# Patient Record
Sex: Female | Born: 1970 | Race: White | Hispanic: No | Smoking: Current every day smoker
Health system: Southern US, Community
[De-identification: ages and names within clinical notes are randomized; demographics above are authoritative.]

## PROBLEM LIST (undated history)

## (undated) DIAGNOSIS — Z9889 Other specified postprocedural states: Secondary | ICD-10-CM

## (undated) DIAGNOSIS — Z9009 Acquired absence of other part of head and neck: Secondary | ICD-10-CM

## (undated) DIAGNOSIS — C569 Malignant neoplasm of unspecified ovary: Secondary | ICD-10-CM

## (undated) DIAGNOSIS — J42 Unspecified chronic bronchitis: Secondary | ICD-10-CM

## (undated) DIAGNOSIS — Z9089 Acquired absence of other organs: Secondary | ICD-10-CM

## (undated) DIAGNOSIS — E890 Postprocedural hypothyroidism: Secondary | ICD-10-CM

## (undated) DIAGNOSIS — J189 Pneumonia, unspecified organism: Secondary | ICD-10-CM

## (undated) HISTORY — DX: Other specified postprocedural states: Z98.890

## (undated) HISTORY — PX: ABDOMINAL HYSTERECTOMY: SHX81

## (undated) HISTORY — DX: Acquired absence of other part of head and neck: Z90.09

## (undated) HISTORY — DX: Acquired absence of other organs: Z90.89

## (undated) HISTORY — PX: SHOULDER SURGERY: SHX246

## (undated) HISTORY — DX: Postprocedural hypothyroidism: E89.0

## (undated) MED ORDER — CODEINE-BUTALBITAL-ACETAMINOPHEN-CAFFEINE 30 MG-50 MG-325 MG-40 MG CAP
50-325-40-30 mg | ORAL_CAPSULE | ORAL | Status: DC | PRN
Start: ? — End: 2014-07-31

---

## 1998-06-06 HISTORY — PX: TOTAL VAGINAL HYSTERECTOMY: SHX2548

## 2005-06-06 HISTORY — PX: THYROIDECTOMY: SHX17

## 2010-06-06 LAB — METABOLIC PANEL, COMPREHENSIVE
A-G Ratio: 1.2 (ref 0.8–1.7)
ALT (SGPT): 29 U/L — ABNORMAL LOW (ref 30–65)
AST (SGOT): 12 U/L — ABNORMAL LOW (ref 15–37)
Albumin: 4.3 g/dL (ref 3.4–5.0)
Alk. phosphatase: 90 U/L (ref 50–136)
Anion gap: 5 mmol/L (ref 5–15)
BUN/Creatinine ratio: 12 (ref 12–20)
BUN: 11 MG/DL (ref 7–18)
Bilirubin, total: 0.3 MG/DL (ref 0.2–1.0)
CO2: 31 MMOL/L (ref 21–32)
Calcium: 9.4 MG/DL (ref 8.4–10.4)
Chloride: 101 MMOL/L (ref 100–108)
Creatinine: 0.9 MG/DL (ref 0.6–1.3)
GFR est AA: >60 mL/min/{1.73_m2} (ref >60)
GFR est non-AA: >60 mL/min/{1.73_m2} (ref >60)
Globulin: 3.6 g/dL (ref 2.0–4.0)
Glucose: 117 MG/DL — ABNORMAL HIGH (ref 74–99)
Potassium: 4 MMOL/L (ref 3.5–5.5)
Protein, total: 7.9 g/dL (ref 6.4–8.2)
Sodium: 137 MMOL/L (ref 136–145)

## 2010-06-06 LAB — CBC WITH AUTOMATED DIFF
ABS. BASOPHILS: 0.1 10*3/uL — ABNORMAL HIGH (ref 0.0–0.06)
ABS. EOSINOPHILS: 0.1 10*3/uL (ref 0.0–0.4)
ABS. LYMPHOCYTES: 1.4 10*3/uL (ref 0.9–3.6)
ABS. MONOCYTES: 0.4 10*3/uL (ref 0.05–1.2)
ABS. NEUTROPHILS: 4.8 10*3/uL (ref 1.8–8.0)
BASOPHILS: 1 % (ref 0–2)
EOSINOPHILS: 2 % (ref 0–5)
HCT: 46.8 % — ABNORMAL HIGH (ref 35.0–45.0)
HGB: 15.8 g/dL (ref 12.0–16.0)
LYMPHOCYTES: 21 % (ref 21–52)
MCH: 31.3 PG (ref 24.0–34.0)
MCHC: 33.8 g/dL (ref 31.0–37.0)
MCV: 92.9 FL (ref 74.0–97.0)
MONOCYTES: 6 % (ref 3–10)
MPV: 10.2 FL (ref 9.2–11.8)
NEUTROPHILS: 70 % (ref 40–73)
PLATELET: 256 10*3/uL (ref 135–420)
RBC: 5.04 M/uL (ref 4.20–5.30)
RDW: 12.6 % (ref 11.6–14.5)
WBC: 6.8 10*3/uL (ref 4.6–13.2)

## 2010-06-06 LAB — URINALYSIS W/ RFLX MICROSCOPIC
Bilirubin: NEGATIVE
Blood: NEGATIVE
Glucose: NEGATIVE MG/DL
Ketone: NEGATIVE MG/DL
Leukocyte Esterase: NEGATIVE
Nitrites: NEGATIVE
Protein: NEGATIVE MG/DL
Specific gravity: 1.02 (ref 1.003–1.030)
Urobilinogen: 0.2 EU/DL (ref 0.2–1.0)
pH (UA): 7.5 (ref 5.0–8.0)

## 2010-06-06 LAB — AMYLASE: Amylase: 81 U/L (ref 25–115)

## 2010-06-06 LAB — LIPASE: Lipase: 119 U/L (ref 73–393)

## 2011-09-27 MED ORDER — ALBUTEROL 90 MCG/ACTUATION AEROSOL INHALER
90 mcg/actuation | RESPIRATORY_TRACT | Status: DC | PRN
Start: 2011-09-27 — End: 2015-02-17

## 2011-09-27 MED ORDER — CODEINE-GUAIFENESIN 10 MG-100 MG/5 ML ORAL LIQUID
100-10 mg/5 mL | Freq: Three times a day (TID) | ORAL | Status: DC | PRN
Start: 2011-09-27 — End: 2014-07-31

## 2011-09-27 MED ORDER — AZITHROMYCIN 250 MG TAB
250 mg | PACK | ORAL | Status: DC
Start: 2011-09-27 — End: 2014-07-31

## 2011-09-27 NOTE — ED Notes (Signed)
I have reviewed discharge instructions with the patient.  The patient verbalized understanding.

## 2011-09-27 NOTE — ED Provider Notes (Signed)
I was personally available for consultation in the emergency department.  I have reviewed the chart and agree with the documentation recorded by the MLP, including the assessment, treatment plan, and disposition.  Yobany Vroom J Isabela Nardelli, MD

## 2011-09-27 NOTE — ED Provider Notes (Signed)
HPI Comments: 41 y.o. female with Hx of bronchitis presents to ED C/O cold Sx x10 days. Sx include productive cough with yellow secretions, congestion, wheezing, difficulty breathing, facial pressure, and ear pain. Pt's tried Mucinex with no relief. Pt Hx of PNA 4x and states she usually has a low grade fever with PNA. Pt denies fever and any other Sx or complaints    -smoker (quit 3 weeks ago), EtOH    Patient is a 41 y.o. female presenting with cough. The history is provided by the patient. No language interpreter was used.   Cough  This is a new problem. Episode onset: 10 days ago. The problem has not changed since onset.Associated symptoms include ear pain and wheezing. Pertinent negatives include no chest pain, no headaches, no rhinorrhea, no sore throat, no myalgias, no shortness of breath, no nausea and no vomiting.        Written by Carolyn Lamb, ED Scribe, as dictated by Carolyn Liner, PA-C.    Past Medical History   Diagnosis Date   ??? Hypothyroid    ??? Pneumonia         Past Surgical History   Procedure Date   ??? Hx thyroidectomy    ??? Hx cesarean section    ??? Hx hysterectomy    ??? Hx shoulder arthroscopy          No family history on file.     History     Social History   ??? Marital Status: SINGLE     Spouse Name: N/A     Number of Children: N/A   ??? Years of Education: N/A     Occupational History   ??? Not on file.     Social History Main Topics   ??? Smoking status: Former Smoker     Quit date: 09/06/2011   ??? Smokeless tobacco: Not on file   ??? Alcohol Use:    ??? Drug Use:    ??? Sexually Active:      Other Topics Concern   ??? Not on file     Social History Narrative   ??? No narrative on file                  ALLERGIES: Review of patient's allergies indicates no known allergies.      Review of Systems   Constitutional: Negative for fever and fatigue.   HENT: Positive for ear pain and congestion. Negative for sore throat and rhinorrhea.         +facial pain   Respiratory: Positive for cough and wheezing.  Negative for shortness of breath.         +difficulty breathing   Cardiovascular: Negative for chest pain and palpitations.   Gastrointestinal: Negative for nausea, vomiting, abdominal pain and diarrhea.   Genitourinary: Negative for dysuria and difficulty urinating.   Musculoskeletal: Negative for myalgias and arthralgias.   Skin: Negative for color change and rash.   Neurological: Negative for light-headedness and headaches.   All other systems reviewed and are negative.        Written by Carolyn Lamb, ED Scribe, as dictated by Carolyn Liner, PA-C.     Filed Vitals:    09/26/40 0741   BP: 127/91   Pulse: 88   Temp: 97.9 ??F (36.6 ??C)   Resp: 22   Height: 5' (1.524 m)   Weight: 57.607 kg (127 lb)   SpO2: 100%            Physical Exam  Nursing note and vitals reviewed.  Constitutional: She is oriented to person, place, and time. She appears well-developed and well-nourished. No distress.   HENT:   Head: Normocephalic and atraumatic.   Right Ear: Tympanic membrane, external ear and ear canal normal.   Left Ear: Tympanic membrane, external ear and ear canal normal.   Nose: Mucosal edema and rhinorrhea present. Right sinus exhibits maxillary sinus tenderness. Left sinus exhibits maxillary sinus tenderness.   Mouth/Throat: Uvula is midline, oropharynx is clear and moist and mucous membranes are normal. No oropharyngeal exudate, posterior oropharyngeal edema or posterior oropharyngeal erythema.   Eyes: EOM are normal. Pupils are equal, round, and reactive to light. Right eye exhibits no discharge. Left eye exhibits no discharge.   Neck: Trachea normal, normal range of motion and full passive range of motion without pain. Neck supple. No rigidity.   Cardiovascular: Normal rate, regular rhythm, normal heart sounds and normal pulses.  Exam reveals no gallop and no friction rub.    No murmur heard.  Pulmonary/Chest: Effort normal. No respiratory distress. She has no wheezes. She has rhonchi. She has no rales. She  exhibits no tenderness.        Mild scattered rhonchi   Abdominal: Soft. Bowel sounds are normal. She exhibits no distension and no mass. There is no tenderness. There is no rebound and no guarding.   Musculoskeletal: Normal range of motion.   Lymphadenopathy:     She has no cervical adenopathy.   Neurological: She is alert and oriented to person, place, and time.   Skin: Skin is warm and dry. No rash noted. She is not diaphoretic.   Psychiatric: She has a normal mood and affect. Judgment normal.        MDM    Procedures    Results    Labs Reviewed - No data to display    No results found for this or any previous visit (from the past 12 hour(s)).    CLINICAL IMPRESSION    1. Bronchitis      7:51 AM  Carolyn Lamb has been counseled regarding her diagnosis, treatment, and plan.  She verbally conveys understanding and agreement of the signs, symptoms, diagnosis, treatment and prognosis and additionally agrees to follow up as discussed.  She also agrees with the care-plan and conveys that all of her questions have been answered.  I have also provided discharge instructions for her that include: educational information regarding their diagnosis and treatment, and list of reasons why they would want to return to the ED prior to their follow-up appointment, should her condition change.    Written by Carolyn Lamb, ED Scribe, as dictated by Carolyn Liner, PA-C.

## 2011-09-27 NOTE — ED Notes (Signed)
Pt states "I feel horrible. I feel like I have bronchitis. I've been like this for ten days. I horrible horrible cough. Both my ears are cloggegd up."

## 2012-10-13 LAB — CBC WITH AUTOMATED DIFF
ABS. BASOPHILS: 0 10*3/uL (ref 0.0–0.06)
ABS. EOSINOPHILS: 0.1 10*3/uL (ref 0.0–0.4)
ABS. LYMPHOCYTES: 1 10*3/uL (ref 0.9–3.6)
ABS. MONOCYTES: 0.4 10*3/uL (ref 0.05–1.2)
ABS. NEUTROPHILS: 7.5 10*3/uL (ref 1.8–8.0)
BASOPHILS: 0 % (ref 0–2)
EOSINOPHILS: 1 % (ref 0–5)
HCT: 44.6 % (ref 35.0–45.0)
HGB: 15.3 g/dL (ref 12.0–16.0)
LYMPHOCYTES: 11 % — ABNORMAL LOW (ref 21–52)
MCH: 31.9 PG (ref 24.0–34.0)
MCHC: 34.3 g/dL (ref 31.0–37.0)
MCV: 92.9 FL (ref 74.0–97.0)
MONOCYTES: 4 % (ref 3–10)
MPV: 10.5 FL (ref 9.2–11.8)
NEUTROPHILS: 84 % — ABNORMAL HIGH (ref 40–73)
PLATELET: 254 10*3/uL (ref 135–420)
RBC: 4.8 M/uL (ref 4.20–5.30)
RDW: 12.3 % (ref 11.6–14.5)
WBC: 9 10*3/uL (ref 4.6–13.2)

## 2012-10-13 LAB — METABOLIC PANEL, BASIC
Anion gap: 8 mmol/L (ref 3.0–18)
BUN/Creatinine ratio: 17 (ref 12–20)
BUN: 14 MG/DL (ref 7.0–18)
CO2: 28 mmol/L (ref 21–32)
Calcium: 9 MG/DL (ref 8.5–10.1)
Chloride: 103 mmol/L (ref 100–108)
Creatinine: 0.83 MG/DL (ref 0.6–1.3)
GFR est AA: >60 mL/min/{1.73_m2} (ref >60)
GFR est non-AA: >60 mL/min/{1.73_m2} (ref >60)
Glucose: 106 mg/dL — ABNORMAL HIGH (ref 74–99)
Potassium: 3.9 mmol/L (ref 3.5–5.5)
Sodium: 139 mmol/L (ref 136–145)

## 2012-10-13 MED ORDER — AMOXICILLIN 500 MG TABLET
500 mg | ORAL_TABLET | Freq: Three times a day (TID) | ORAL | Status: AC
Start: 2012-10-13 — End: 2012-10-23

## 2012-10-13 MED ADMIN — sodium chloride 0.9 % bolus infusion 1,000 mL: INTRAVENOUS | @ 19:00:00 | NDC 00409798309

## 2012-10-13 MED ADMIN — ondansetron (ZOFRAN) injection 4 mg: INTRAVENOUS | @ 19:00:00 | NDC 00409475503

## 2012-10-13 MED ADMIN — ketorolac (TORADOL) injection 30 mg: INTRAVENOUS | @ 19:00:00 | NDC 00409379501

## 2012-10-13 MED ADMIN — butalbital-acetaminophen-caffeine (FIORICET, ESGIC) per tablet 1 Tab: ORAL | @ 21:00:00 | NDC 68084039611

## 2012-10-13 NOTE — ED Notes (Signed)
C/o headache since 1000 today, dizziness, facial pain, nausea, vomiting, pt works in the heat. States its the worse headache in her life.

## 2012-10-13 NOTE — ED Notes (Signed)
Patient armband removed and shreddedI have reviewed discharge instructions with the patient.  The patient verbalized understanding. rx x 2 given; pt with visitor at bedside;

## 2012-10-13 NOTE — ED Provider Notes (Signed)
HPI Comments: 2:12 PM   42 y.o. female presents to ED C/O frontal HA, which began gradually 4 hours ago.  Associated sx's include nausea, vomiting (6-7 episodes today), photophobia, dizziness, facial pain, and chills. Pt reports she woke up this morning with what felt like a sinus HA, took Sudafed PE. States her HA worsened over a few hours then developed nausea and vomitin. Took Advil with no relief. The HA is located in a band across her forehead that stops at the temples. States this feels worse than her previous "sinus HAs." Confirms some blurred vision while driving earlier but none now; no diplopia, scotomata or flashing lights. HA is worse with laying flat and moving head side-to-side. Dizziness is brought on by movement of head. She is not dizzy at rest.  Pt also confirms rhinorrhea, nasal congestion, sneezing and dry cough x2 days prior to onset of HA. Pt reports 2 of her children have sinus infections and another has PNA. SHx includes thyroidectomy. Takes Synthroid. Pt denies Hx of HA or migraine, HTN, diplopia, fever, ear pain, sore throat, one-sided weakness, numbness or tingling in ext, difficulty breathing, neck pain, back pain and any other sx's or complaints.    Patient is a 42 y.o. female presenting with headaches. The history is provided by the patient. No language interpreter was used.   Headache   This is a new problem. The current episode started 3 to 5 hours ago. The problem occurs constantly. The problem has not changed since onset.The headache is aggravated by photophobia. The pain is located in the frontal region. Associated symptoms include dizziness, nausea and vomiting. Pertinent negatives include no fever, no palpitations, no shortness of breath and no weakness.   Written by Broadus John, ED Scribe, as dictated by Zebedee Iba, PA-C.      Past Medical History   Diagnosis Date   ??? Hypothyroid    ??? Pneumonia         Past Surgical History   Procedure Laterality Date   ??? Hx  thyroidectomy     ??? Hx cesarean section     ??? Hx hysterectomy     ??? Hx shoulder arthroscopy           No family history on file.     History     Social History   ??? Marital Status: SINGLE     Spouse Name: N/A     Number of Children: N/A   ??? Years of Education: N/A     Occupational History   ??? Not on file.     Social History Main Topics   ??? Smoking status: Former Smoker     Quit date: 09/06/2011   ??? Smokeless tobacco: Not on file   ??? Alcohol Use:    ??? Drug Use:    ??? Sexually Active:      Other Topics Concern   ??? Not on file     Social History Narrative   ??? No narrative on file            ALLERGIES: Flagyl      Review of Systems   Constitutional: Negative for fever and fatigue.   HENT: Positive for congestion, rhinorrhea and sneezing. Negative for sore throat and neck pain.         +Facial pain   Eyes: Positive for photophobia and visual disturbance (resolved, see HPI).   Respiratory: Positive for cough (dry). Negative for shortness of breath.    Cardiovascular: Negative for chest pain  and palpitations.   Gastrointestinal: Positive for nausea and vomiting. Negative for abdominal pain and diarrhea.   Genitourinary: Negative for dysuria and difficulty urinating.   Musculoskeletal: Negative for myalgias and arthralgias.   Skin: Negative for color change and rash.   Neurological: Positive for dizziness and headaches. Negative for weakness and light-headedness.   All other systems reviewed and are negative.        Filed Vitals:    10/13/12 1630 10/13/12 1658 10/13/12 1700 10/13/12 1715   BP: 113/72  117/73 109/91   Pulse:       Temp:       Resp:       Height:       Weight:       SpO2: 99% 100%              Physical Exam   Nursing note and vitals reviewed.     Vital signs and nursing notes reviewed.    CONSTITUTIONAL: Alert. Well-appearing; well-nourished; in dark room in mild pain distress.  HEAD: Normocephalic; atraumatic.  +Pain over frontal and maxillary sinuses.  EYES: PERRL; EOM's intact. No nystagmus. Conjunctiva  clear. +photophobia.  ENT: TM's normal except for mild effusion behind right TM. External ear normal. Normal nose; no rhinorrhea. Normal pharynx. Moist mucus membranes.  NECK: Supple; FROM without difficulty, non-tender; no cervical lymphadenopathy. No rigidity or stiffness.             CV: Normal S1, S2; no murmurs, rubs, or gallops. No chest wall tenderness.  RESPIRATORY: Normal chest excursion with respiration; breath sounds clear and equal bilaterally; no wheezes, rhonchi, or rales.  GI: Normal bowel sounds; non-distended; non-tender; no guarding or rigidity  BACK:  No evidence of trauma or deformity. Non-tender to palpation. FROM without difficulty. Negative straight leg raise bilaterally.  EXT: Normal ROM in all four extremities; non-tender to palpation.  SKIN: Normal for age and race; warm; dry; good turgor; no apparent lesions or exudate.  NEURO: A & O x3. Cranial nerves 2-12 intact. Motor 5/5 bilaterally. Sensation intact. No meningeal signs. Normal coordination, normal gait.   PSYCH:  Mood and affect appropriate.       RESULTS:    CT HEAD WO CONT    Final Result: IMPRESSION:         No acute intracranial findings               Labs Reviewed   CBC WITH AUTOMATED DIFF - Abnormal; Notable for the following:     NEUTROPHILS 84 (*)     LYMPHOCYTES 11 (*)     All other components within normal limits   METABOLIC PANEL, BASIC - Abnormal; Notable for the following:     Glucose 106 (*)     All other components within normal limits       Recent Results (from the past 12 hour(s))   CBC WITH AUTOMATED DIFF    Collection Time     10/13/12  2:42 PM       Result Value Range    WBC 9.0  4.6 - 13.2 K/uL    RBC 4.80  4.20 - 5.30 M/uL    HGB 15.3  12.0 - 16.0 g/dL    HCT 16.1  09.6 - 04.5 %    MCV 92.9  74.0 - 97.0 FL    MCH 31.9  24.0 - 34.0 PG    MCHC 34.3  31.0 - 37.0 g/dL    RDW 40.9  81.1 - 91.4 %  PLATELET 254  135 - 420 K/uL    MPV 10.5  9.2 - 11.8 FL    NEUTROPHILS 84 (*) 40 - 73 %    LYMPHOCYTES 11 (*) 21 - 52 %     MONOCYTES 4  3 - 10 %    EOSINOPHILS 1  0 - 5 %    BASOPHILS 0  0 - 2 %    ABS. NEUTROPHILS 7.5  1.8 - 8.0 K/UL    ABS. LYMPHOCYTES 1.0  0.9 - 3.6 K/UL    ABS. MONOCYTES 0.4  0.05 - 1.2 K/UL    ABS. EOSINOPHILS 0.1  0.0 - 0.4 K/UL    ABS. BASOPHILS 0.0  0.0 - 0.06 K/UL    DF AUTOMATED     METABOLIC PANEL, BASIC    Collection Time     10/13/12  2:42 PM       Result Value Range    Sodium 139  136 - 145 mmol/L    Potassium 3.9  3.5 - 5.5 mmol/L    Chloride 103  100 - 108 mmol/L    CO2 28  21 - 32 mmol/L    Anion gap 8  3.0 - 18 mmol/L    Glucose 106 (*) 74 - 99 mg/dL    BUN 14  7.0 - 18 MG/DL    Creatinine 0.27  0.6 - 1.3 MG/DL    BUN/Creatinine ratio 17  12 - 20      GFR est AA >60  >60 ml/min/1.68m2    GFR est non-AA >60  >60 ml/min/1.63m2    Calcium 9.0  8.5 - 10.1 MG/DL        MDM     Differential Diagnosis; Clinical Impression; Plan:     DDx: migraine, sinusitis, tension HA, ICH, viral illness, labyrinthitis      ED MEDICATIONS:  Medications   sodium chloride 0.9 % bolus infusion 1,000 mL (0 mL IntraVENous IV Completed 10/13/12 1629)   ketorolac (TORADOL) injection 30 mg (30 mg IntraVENous Given 10/13/12 1446)   ondansetron (ZOFRAN) injection 4 mg (4 mg IntraVENous Given 10/13/12 1446)   butalbital-acetaminophen-caffeine (FIORICET, ESGIC) per tablet 1 Tab (1 Tab Oral Given 10/13/12 1729)         Procedures    PROGRESS NOTE:   2:23 PM   Answered pt's questions regarding treatment.  Written by Broadus John, ED Scribe, as dictated by Zebedee Iba, PA-C     PROGRESS NOTE:  4:30 PM  Pt has been re-examined by Zebedee Iba, PA-C. Pt is feeling much better. She says her HA is down from ???way over 10??? to a 6/10. She???s not nauseous, no vomiting, no neck pain, no vision changes or dizziness. Awaiting blood work.   Written by Broadus John, ED Scribe, as dictated by Zebedee Iba, PA-C.     DISCHARGE NOTE:  4:58 PM    Jaci Carrel Beadle's  results have been reviewed with her.  She has been counseled regarding  her diagnosis, treatment, and plan.  She verbally conveys understanding and agreement of the signs, symptoms, diagnosis, treatment and prognosis and additionally agrees to follow up as discussed.  She also agrees with the care-plan and conveys that all of her questions have been answered.  I have also provided discharge instructions for her that include: educational information regarding their diagnosis and treatment, and list of reasons why they would want to return to the ED prior to their follow-up appointment, should her condition change.     CLINICAL  IMPRESSION:    1. Frontal headache    2. Sinusitis        AFTER VISIT PLAN:  1. Rx Amoxicillin and Fioricet  2. Follow-up with PCP  3. Return if sx's worsen    Written by Broadus John, ED Scribe, as dictated by Zebedee Iba, PA-C

## 2012-10-14 NOTE — ED Provider Notes (Signed)
I was personally available for consultation in the emergency department.  I have reviewed the chart and agree with the documentation recorded by the MLP, including the assessment, treatment plan, and disposition.  Tashan Kreitzer J Kailon Treese, MD

## 2014-07-31 ENCOUNTER — Inpatient Hospital Stay
Admit: 2014-07-31 | Discharge: 2014-08-01 | Disposition: A | Discharge status: Home / Self Care | Payer: Self-pay | Attending: Internal Medicine

## 2014-07-31 ENCOUNTER — Emergency Department: Admit: 2014-08-01 | Payer: Self-pay

## 2014-07-31 DIAGNOSIS — R109 Unspecified abdominal pain: Secondary | ICD-10-CM

## 2014-07-31 LAB — URINALYSIS W/ RFLX MICROSCOPIC
Bilirubin: NEGATIVE
Blood: NEGATIVE
Glucose: NEGATIVE mg/dL
Ketone: NEGATIVE mg/dL
Leukocyte Esterase: NEGATIVE
Nitrites: NEGATIVE
Protein: NEGATIVE mg/dL
Specific gravity: 1.021 (ref 1.005–1.030)
Urobilinogen: 0.2 EU/dL (ref 0.2–1.0)
pH (UA): 6 (ref 5.0–8.0)

## 2014-07-31 NOTE — ED Notes (Signed)
Pt states possible uti, pt states pain after urination, pt reports hx of kidney stones, pt states pain is in back and wraps around to abd on both sides, onset 2 days ago

## 2014-07-31 NOTE — ED Notes (Signed)
Carolyn Lamb was discharged in good and improved condition. The patient's diagnosis, condition and treatment were explained to patient and aftercare instructions were given. The patient verbalized good understanding. Patient armband removed and both labels and armband were placed in shred bin. Patient left ER ambulatory. 2 prescriptions provided. Patient reports pain is currently 8 on numeric scale. Patient provided education about pain medication including dosage and side effects. Patient verbalized understanding. Ride (husband) at bedside to provide transportation home.

## 2014-07-31 NOTE — ED Provider Notes (Signed)
HPI Comments:   6:01 PM  44 y.o. female with a hx of kidney stones presents to ED with her husband C/O right flank pain radiating to the right groin onset 2 days ago that worsened today. The pain worsens when sitting on the right side. Associated symptoms include hematuria and nausea. Pt took Advil for the pain with no relief. PSHx includes hysterectomy and C-section. Patient denies vomiting, diarrhea, fever, or any other symptoms or complaints.     Patient is a 44 y.o. female presenting with flank pain. The history is provided by the patient and the spouse. No language interpreter was used.   Flank Pain   This is a new problem. The current episode started 2 days ago. The problem has been gradually worsening. The pain is present in the right side. The pain radiates to the right groin. Pertinent negatives include no chest pain, no fever, no headaches, no abdominal pain and no dysuria. Treatments tried: Advil. The treatment provided no relief. Risk factors include history of kidney stones.      Written by Freddy JakschKristen Carter, ED Scribe, as dictated by Synthia InnocentBrannon Adonis Yim, PA-C     Past Medical History:   Diagnosis Date   ??? Hypothyroid    ??? Pneumonia    ??? Kidney stone        Past Surgical History:   Procedure Laterality Date   ??? Hx thyroidectomy     ??? Hx cesarean section     ??? Hx hysterectomy     ??? Hx shoulder arthroscopy           History reviewed. No pertinent family history.    History     Social History   ??? Marital Status: SINGLE     Spouse Name: N/A   ??? Number of Children: N/A   ??? Years of Education: N/A     Occupational History   ??? Not on file.     Social History Main Topics   ??? Smoking status: Current Every Day Smoker -- 0.50 packs/day     Last Attempt to Quit: 09/06/2011   ??? Smokeless tobacco: Not on file   ??? Alcohol Use: Yes      Comment: rare   ??? Drug Use: No   ??? Sexual Activity: Not on file     Other Topics Concern   ??? Not on file     Social History Narrative       ALLERGIES: Flagyl      Review of Systems    Constitutional: Negative for fever and fatigue.   HENT: Negative for rhinorrhea and sore throat.    Respiratory: Negative for cough and shortness of breath.    Cardiovascular: Negative for chest pain and palpitations.   Gastrointestinal: Positive for nausea. Negative for vomiting, abdominal pain and diarrhea.   Genitourinary: Positive for hematuria and flank pain (right). Negative for dysuria and difficulty urinating.        Right groin pain   Musculoskeletal: Negative for myalgias and arthralgias.   Skin: Negative for color change and rash.   Neurological: Negative for light-headedness and headaches.   All other systems reviewed and are negative.      Filed Vitals:    07/31/14 1643   BP: 124/86   Pulse: 100   Temp: 98.3 ??F (36.8 ??C)   Resp: 16   Height: 5' (1.524 m)   Weight: 57.607 kg (127 lb)   SpO2: 98%            Physical  Exam   Constitutional: She is oriented to person, place, and time. She appears well-developed and well-nourished. No distress.   HENT:   Head: Normocephalic and atraumatic.   Eyes: Conjunctivae and EOM are normal. Pupils are equal, round, and reactive to light.   Neck: Normal range of motion. Neck supple.   Cardiovascular: Normal rate and regular rhythm.    Pulmonary/Chest: Effort normal and breath sounds normal.   Abdominal: Soft. Bowel sounds are normal. She exhibits no distension. There is no tenderness. There is CVA tenderness (right). There is no rebound and no guarding.   Musculoskeletal: Normal range of motion.        Back:    Neurological: She is alert and oriented to person, place, and time.   Skin: Skin is warm and dry.   Psychiatric: She has a normal mood and affect. Her behavior is normal.   Nursing note and vitals reviewed.       RESULTS:    CT ABD PELV WO CONT   Final Result   Impression:  1.?? Nonobstructing left nephrolithiasis.???????? ??  2.?? No evidence for bladder stone. ??  3.?? No evidence for bowel obstruction.???????? ??  As read by the radiologist.        Labs Reviewed    URINALYSIS W/ RFLX MICROSCOPIC       Recent Results (from the past 12 hour(s))   URINALYSIS W/ RFLX MICROSCOPIC    Collection Time: 07/31/14  4:46 PM   Result Value Ref Range    Color YELLOW      Appearance CLOUDY      Specific gravity 1.021 1.005 - 1.030      pH (UA) 6.0 5.0 - 8.0      Protein NEGATIVE  NEG mg/dL    Glucose NEGATIVE  NEG mg/dL    Ketone NEGATIVE  NEG mg/dL    Bilirubin NEGATIVE  NEG      Blood NEGATIVE  NEG      Urobilinogen 0.2 0.2 - 1.0 EU/dL    Nitrites NEGATIVE  NEG      Leukocyte Esterase NEGATIVE  NEG          MDM  Number of Diagnoses or Management Options     Amount and/or Complexity of Data Reviewed  Clinical lab tests: ordered and reviewed  Tests in the radiology section of CPT??: ordered and reviewed (CT Abd/Pelv)        MEDICATIONS GIVEN:  Medications   HYDROcodone-acetaminophen (NORCO) 5-325 mg per tablet 2 Tab (not administered)        Procedures    PROGRESS NOTE:  6:01 PM  Initial assessment performed.  Written by Freddy Jaksch, ED Scribe, as dictated by Synthia Innocent, PA-C    DISCHARGE NOTE:  9:35 PM   Jaci Carrel Crittendon's  results have been reviewed with her.  She has been counseled regarding her diagnosis, treatment, and plan.  She verbally conveys understanding and agreement of the signs, symptoms, diagnosis, treatment and prognosis and additionally agrees to follow up as discussed.  She also agrees with the care-plan and conveys that all of her questions have been answered.  I have also provided discharge instructions for her that include: educational information regarding their diagnosis and treatment, and list of reasons why they would want to return to the ED prior to their follow-up appointment, should her condition change.    CLINICAL IMPRESSION:    1. Flank pain        PLAN: DISCHARGE HOME    Follow-up  Information     Follow up With Details Comments Contact Info    Al Decant, MD Schedule an appointment as soon as possible for a  visit in 1 week Follow up with primary care. [address] [facility]  [address]  [place] [address]  [phone]      [facility] EMERGENCY DEPT  As needed, If symptoms worsen [address]  [place] [address]  [phone]          Current Discharge Medication List      START taking these medications    Details   carisoprodol (SOMA) 350 mg tablet Take 1 Tab by mouth four (4) times daily. Max Daily Amount: 1,400 mg.  Qty: 20 Tab, Refills: 0      traMADol (ULTRAM) 50 mg tablet Take 1 Tab by mouth every six (6) hours as needed for Pain. Max Daily Amount: 200 mg.  Qty: 20 Tab, Refills: 0         CONTINUE these medications which have NOT CHANGED    Details   levothyroxine (SYNTHROID) 100 mcg tablet Take 100 mcg by mouth Daily (before breakfast).      albuterol (PROVENTIL, VENTOLIN) 90 mcg/actuation inhaler Take 1-2 Puffs by inhalation every four (4) hours as needed for Wheezing.  Qty: 17 g, Refills: 0             This note is prepared by Freddy Jaksch, acting as Neurosurgeon for [employer], PA-C.    Maclane Holloran, PA-C: The scribe's documentation has been prepared under my direction and personally reviewed by me in its entirety. I confirm that the note above accurately reflects all work, treatment, procedures, and medical decision making performed by me.

## 2014-07-31 NOTE — ED Notes (Signed)
Received report from Edison Pace Griffiths, RN and assumed care of patient. Patient ambulated to bathroom with steady gait, no additional needs verbalized at this time.

## 2014-07-31 NOTE — Progress Notes (Signed)
Mark to return lighter to patient

## 2014-08-01 MED ORDER — HYDROCODONE-ACETAMINOPHEN 5 MG-325 MG TAB
5-325 mg | ORAL | Status: AC
Start: 2014-08-01 — End: 2014-07-31
  Administered 2014-08-01: 03:00:00 via ORAL

## 2014-08-01 MED ORDER — TRAMADOL 50 MG TAB
50 mg | ORAL_TABLET | Freq: Four times a day (QID) | ORAL | Status: DC | PRN
Start: 2014-08-01 — End: 2015-02-17

## 2014-08-01 MED ORDER — CARISOPRODOL 350 MG TAB
350 mg | ORAL_TABLET | Freq: Four times a day (QID) | ORAL | Status: DC
Start: 2014-08-01 — End: 2015-02-17

## 2014-08-01 MED FILL — HYDROCODONE-ACETAMINOPHEN 5 MG-325 MG TAB: 5-325 mg | ORAL | Qty: 2

## 2015-02-17 ENCOUNTER — Inpatient Hospital Stay
Admit: 2015-02-17 | Discharge: 2015-02-17 | Disposition: A | Discharge status: Home / Self Care | Payer: Self-pay | Attending: Emergency Medicine

## 2015-02-17 DIAGNOSIS — J0100 Acute maxillary sinusitis, unspecified: Secondary | ICD-10-CM

## 2015-02-17 MED ORDER — AMOXICILLIN CLAVULANATE 875 MG-125 MG TAB
875-125 mg | ORAL_TABLET | Freq: Two times a day (BID) | ORAL | 0 refills | Status: AC
Start: 2015-02-17 — End: 2015-02-27

## 2015-02-17 MED ORDER — TRAMADOL 50 MG TAB
50 mg | ORAL_TABLET | Freq: Four times a day (QID) | ORAL | 0 refills | Status: DC | PRN
Start: 2015-02-17 — End: 2015-06-19

## 2015-02-17 MED ORDER — PSEUDOEPHEDRINE-GUAIFENESIN SR 60 MG-600 MG 12 HR TAB
60-600 mg | ORAL_TABLET | Freq: Two times a day (BID) | ORAL | 0 refills | Status: DC
Start: 2015-02-17 — End: 2015-06-19

## 2015-02-17 NOTE — ED Triage Notes (Signed)
Pt complains of left ear pain, sinus pain, cough, congestion.  Was seen by her PCP three weeks ago and treated for the same, completed a zpack and states she is no better.  Not able to see PCP for another week.

## 2015-02-17 NOTE — ED Notes (Signed)
I have reviewed discharge instructions with the patient.  The patient verbalized understanding.

## 2015-02-17 NOTE — ED Provider Notes (Signed)
HPI Comments: 44y.o. female presents to ED complaining of sinus pain for 1 month and chronic left shoulder pain onset 4 days ago.  Patient has had thick yellow/green nasal drainage and facial pain she describes as achy and pressure for 1 month.  3 weeks ago she saw her PCP for this and was Rx Z-pack and feels like she has worsened since then.  She states she has fevers, chills, fatigue, general throbbing headaches and malaise.  She also states her chronic left shoulder bursitis is bothering her.  She sees Dr. Luci Bank and has cortisone shots, last was 3 months ago.  She usually takes Tramadol for this but has been out.    Patient denies CP, SOB or any other symptoms or complaints.     Patient is a 44 y.o. female presenting with ear pain, sinus pain, cough, and shoulder pain. The history is provided by the patient.   Ear Pain   Pertinent negatives include no chest pain, no abdominal pain, no headaches and no shortness of breath.   Sinus Pain    Associated symptoms include chills, ear pain, sinus pressure and cough. Pertinent negatives include no congestion, no shortness of breath, no neck pain, no neck pain, no headaches and no chest pain.   Cough   Associated symptoms include chills and ear pain. Pertinent negatives include no chest pain, no headaches and no shortness of breath.   Shoulder Pain           Past Medical History:   Diagnosis Date   ??? Hypothyroid    ??? Kidney stone    ??? Pneumonia        Past Surgical History:   Procedure Laterality Date   ??? Hx thyroidectomy     ??? Hx cesarean section     ??? Hx hysterectomy     ??? Hx shoulder arthroscopy           History reviewed. No pertinent family history.    Social History     Social History   ??? Marital status: SINGLE     Spouse name: N/A   ??? Number of children: N/A   ??? Years of education: N/A     Occupational History   ??? Not on file.     Social History Main Topics   ??? Smoking status: Former Smoker     Packs/day: 0.50     Quit date: 02/03/2015    ??? Smokeless tobacco: Not on file   ??? Alcohol use Yes      Comment: rare   ??? Drug use: No   ??? Sexual activity: Not on file     Other Topics Concern   ??? Not on file     Social History Narrative         ALLERGIES: Flagyl [metronidazole]    Review of Systems   Constitutional: Positive for chills, fatigue and fever. Negative for activity change and unexpected weight change.   HENT: Positive for ear pain and sinus pressure. Negative for congestion.    Respiratory: Positive for cough. Negative for shortness of breath.    Cardiovascular: Negative for chest pain.   Gastrointestinal: Negative for abdominal pain.   Genitourinary: Negative for dysuria.   Musculoskeletal: Negative for neck pain.        Left shoulder pain   Skin: Negative for rash.   Neurological: Negative for syncope and headaches.   All other systems reviewed and are negative.      Vitals:    02/17/15 1053  BP: 138/82   Pulse: 84   Resp: 20   Temp: 98.3 ??F (36.8 ??C)   SpO2: 100%   Weight: 55.3 kg (122 lb)   Height: 5' (1.524 m)            Physical Exam   Constitutional: She is oriented to person, place, and time. She appears well-developed and well-nourished. She appears ill. No distress.   HENT:   Head: Normocephalic and atraumatic.   Right Ear: Tympanic membrane, external ear and ear canal normal.   Left Ear: Tympanic membrane, external ear and ear canal normal.   Nose: Mucosal edema and rhinorrhea present. Right sinus exhibits maxillary sinus tenderness. Left sinus exhibits maxillary sinus tenderness.   Mouth/Throat: Uvula is midline, oropharynx is clear and moist and mucous membranes are normal. No oropharyngeal exudate, posterior oropharyngeal edema or posterior oropharyngeal erythema.   Eyes: EOM are normal. Pupils are equal, round, and reactive to light. Right eye exhibits no discharge. Left eye exhibits no discharge.   Neck: Trachea normal, normal range of motion and full passive range of motion without pain. Neck supple. No rigidity.    Cardiovascular: Normal rate, regular rhythm, normal heart sounds and normal pulses.  Exam reveals no gallop and no friction rub.    No murmur heard.  Pulmonary/Chest: Effort normal and breath sounds normal. No respiratory distress. She has no wheezes. She has no rales. She exhibits no tenderness.   Abdominal: Soft. Bowel sounds are normal. She exhibits no distension and no mass. There is no tenderness. There is no rebound and no guarding.   Musculoskeletal: Normal range of motion.        Left shoulder: She exhibits tenderness, pain and decreased strength. She exhibits normal range of motion, no bony tenderness, no swelling, no deformity, no spasm and normal pulse.        Arms:  Lymphadenopathy:     She has no cervical adenopathy.   Neurological: She is alert and oriented to person, place, and time.   Skin: Skin is warm and dry. No rash noted. She is not diaphoretic.   Psychiatric: She has a normal mood and affect. Judgment normal.   Nursing note and vitals reviewed.       MDM  ED Course       Procedures    PROGRESS NOTE:  11:00 AM   Initial assessment performed.    DISCHARGE NOTE:   11:11 AM   Rwanda N Rippetoe's results have been reviewed with patient and/or family. Patient and/or family has been counseled regarding diagnosis, treatment, and plan.  Patient and/or family verbally conveys understanding and agreement of the signs, symptoms, diagnosis, treatment and prognosis and additionally agrees to follow up as discussed.  Patient and/or family also agrees with the care-plan and conveys that all of his/her questions have been answered.  I have also provided discharge instructions for the patient and/or family that include: educational information regarding their diagnosis and treatment, and list of reasons why they would want to return to the ED prior to their follow-up appointment, should patient's condition change.     CLINICAL IMPRESSION    1. Acute maxillary sinusitis, recurrence not specified     2. Bursitis, shoulder, left         AFTER VISIT PLAN    Follow-up Information     Follow up With Details Comments Contact Info    Al Decant, MD Schedule an appointment as soon as possible for a visit in 3 days  [phone]  [facility]  [address]  [place] [address]  [phone]      Alisa Graff, MD Schedule an appointment as soon as possible for a visit in 3 days  [address] [place]  [address]  [place] [address]  [phone]      [facility] EMERGENCY DEPT  If symptoms worsen [address]  [place] [address]  [phone]          Current Discharge Medication List      START taking these medications    Details   amoxicillin-clavulanate (AUGMENTIN) 875-125 mg per tablet Take 1 Tab by mouth two (2) times a day for 10 days.  Qty: 20 Tab, Refills: 0      traMADol (ULTRAM) 50 mg tablet Take 1 Tab by mouth every six (6) hours as needed for Pain. Max Daily Amount: 200 mg.  Qty: 12 Tab, Refills: 0      PSEUDOEPHEDRINE-guaiFENesin (MUCINEX D) 60-600 mg per tablet Take 1 Tab by mouth every twelve (12) hours.  Qty: 15 Tab, Refills: 0         CONTINUE these medications which have NOT CHANGED    Details   levothyroxine (SYNTHROID) 100 mcg tablet Take 100 mcg by mouth Daily (before breakfast).

## 2015-06-05 ENCOUNTER — Inpatient Hospital Stay
Admit: 2015-06-05 | Discharge: 2015-06-05 | Disposition: A | Discharge status: Home / Self Care | Payer: Self-pay | Attending: Emergency Medicine

## 2015-06-05 DIAGNOSIS — J111 Influenza due to unidentified influenza virus with other respiratory manifestations: Secondary | ICD-10-CM

## 2015-06-05 MED ORDER — CODEINE-GUAIFENESIN 10 MG-100 MG/5 ML ORAL LIQUID
100-10 mg/5 mL | Freq: Three times a day (TID) | ORAL | 0 refills | Status: DC | PRN
Start: 2015-06-05 — End: 2015-06-19

## 2015-06-05 MED ORDER — ALBUTEROL SULFATE HFA 90 MCG/ACTUATION AEROSOL INHALER
90 mcg/actuation | RESPIRATORY_TRACT | 0 refills | Status: DC | PRN
Start: 2015-06-05 — End: 2015-06-19

## 2015-06-05 NOTE — ED Provider Notes (Signed)
[facility]  EMERGENCY DEPARTMENT HISTORY AND PHYSICAL EXAM       Date: 06/05/2015   Patient Name: Carolyn Lamb   Date of Birth: 07/18/1970  Medical Record Number: [id number]    History of Presenting Illness     Chief Complaint   Patient presents with   ??? Cough        History Provided By:  patient    Additional History:   12:49 PM  Carolyn Lamb is a 44 y.o. female with a h/o PNA and bronchitis who presents to the emergency department C/O sore throat onset five days ago. Associated sx include bilateral ear pain, dry cough, HA, fatigue, facial sinus pain, and CP secondary to congestion. Pt took Mucinex DM with no relief. Pt has had contact with family with similar sx. Pt has never had a flu shot. Denies SHx of tonsillectomy. Former tobacco user (quit 02/03/2015). Denies hx of HTN/DM and any other sx or complaints at this time.     Primary Care Provider: Al DecantSTEPHEN B STROUD, MD   Specialist:    Past History     Past Medical History:   Past Medical History   Diagnosis Date   ??? Hypothyroid    ??? Kidney stone    ??? Pneumonia         Past Surgical History:   Past Surgical History   Procedure Laterality Date   ??? Hx thyroidectomy     ??? Hx cesarean section     ??? Hx hysterectomy     ??? Hx shoulder arthroscopy          Family History:   No family history on file.     Social History:   Social History   Substance Use Topics   ??? Smoking status: Former Smoker     Packs/day: 0.50     Quit date: 02/03/2015   ??? Smokeless tobacco: Not on file   ??? Alcohol use Yes      Comment: rare        Allergies:   Allergies   Allergen Reactions   ??? Flagyl [Metronidazole] Hives        Review of Systems   Review of Systems   Constitutional: Positive for fatigue. Negative for fever.   HENT: Positive for congestion, ear pain (bilateral) and sore throat.         (+) sinus pain   Respiratory: Positive for cough (dry).    Cardiovascular: Positive for chest pain (secondary to congestion).    Neurological: Positive for headaches.   All other systems reviewed and are negative.      Physical Exam  Vitals:    06/05/15 1230   BP: 126/89   Pulse: 76   Resp: 16   Temp: 97.8 ??F (36.6 ??C)   SpO2: 99%   Weight: 54.4 kg (120 lb)   Height: 5' (1.524 m)       Physical Exam   Nursing note and vitals reviewed.  Vital signs and nursing notes reviewed.    CONSTITUTIONAL: Alert. nontoxic-appearing; well-nourished; in no apparent distress.  HEAD: Normocephalic; atraumatic.  EYES: PERRL; Conjunctiva clear.   ENT: TM's normal. External ear normal. Normal nose; no rhinorrhea. Normal pharynx. Tonsils not enlarged without exudate. Moist mucus membranes.  NECK: Supple; FROM without difficulty, non-tender; no cervical lymphadenopathy.             CV: Normal S1, S2; no murmurs, rubs, or gallops. No chest wall tenderness.  RESPIRATORY: Normal chest  excursion with respiration; breath sounds clear and equal bilaterally; no wheezes, rhonchi, or rales.  SKIN: Normal for age and race; warm; dry; good turgor; no apparent lesions or exudate.  NEURO: A & O x3.   PSYCH:  Mood and affect appropriate.       Diagnostic Study Results     Labs -      Recent Results (from the past 12 hour(s))   STREP THROAT SCREEN    Collection Time: 06/05/15 12:52 PM   Result Value Ref Range    Special Requests: NO SPECIAL REQUESTS      Strep Screen NEGATIVE       Strep Screen (NOTE)  tET PEFORMED BY [id number]         Culture result: PENDING        Radiologic Studies -  No orders to display       Medical Decision Making   I am the first provider for this patient.     I reviewed the vital signs, available nursing notes, past medical history, past surgical history, family history and social history.     Vital Signs-Reviewed the patient's vital signs.   Patient Vitals for the past 12 hrs:   Temp Pulse Resp BP SpO2   06/05/15 1230 97.8 ??F (36.6 ??C) 76 16 126/89 99 %       Pulse Oximetry Analysis - Normal 99% on RA     Medications Given in the ED:   Medications - No data to display     PROGRESS NOTE:  1:18 PM  Initial assessment performed.  Written by Leanora Ivanoff, ED scribe, as dictated by Zebedee Iba, PA-C.    Discharge Note:  1:18 PM   Patient has no new complaints, changes, or physical findings.  Care plan outlined and precautions discussed.  Results were reviewed with the patient. All medications were reviewed with the patient; will d/c home with cheratussin. All of pt's questions and concerns were addressed. Patient was instructed and agrees to follow up with PCP, as well as to return to the ED upon further deterioration. Patient is ready to go home.      Diagnosis   Clinical Impression:   1. Influenza-like illness         Discussion:    Follow-up Information     Follow up With Details Comments Contact Info    Al Decant, MD Schedule an appointment as soon as possible for a visit in 2 days Follow up with primary care physician.  [address] [facility]  [address]  [place] [address]  [phone]      [facility] EMERGENCY DEPT  As needed, If symptoms worsen [address]  [place] [address]  [phone]          Current Discharge Medication List      START taking these medications    Details   guaiFENesin-codeine (CHERATUSSIN AC) 100-10 mg/5 mL solution Take 5 mL by mouth three (3) times daily as needed for Cough. Max Daily Amount: 15 mL.  Qty: 120 mL, Refills: 0         CONTINUE these medications which have NOT CHANGED    Details   cetirizine (ZYRTEC) 10 mg tablet Take  by mouth.      ibuprofen (ADVIL) 200 mg tablet Take  by mouth.      traMADol (ULTRAM) 50 mg tablet Take 1 Tab by mouth every six (6) hours as needed for Pain. Max Daily Amount: 200  mg.  Qty: 12 Tab, Refills: 0      PSEUDOEPHEDRINE-guaiFENesin (MUCINEX D) 60-600 mg per tablet Take 1 Tab by mouth every twelve (12) hours.  Qty: 15 Tab, Refills: 0      levothyroxine (SYNTHROID) 100 mcg tablet Take 100 mcg by mouth Daily (before breakfast).              _______________________________   Attestations:     This note is prepared by .Leanora Ivanoff, acting as a Neurosurgeon for [employer], PA-C    [employer], PA-C: The scribe's documentation has been prepared under my direction and personally reviewed by me in its entirety.  _______________________________

## 2015-06-05 NOTE — ED Triage Notes (Signed)
C/o cough, congestion, sore throat, body aches for 5 days, denies fever.

## 2015-06-05 NOTE — ED Notes (Signed)
I have reviewed discharge instructions with the patient.  The patient verbalized understanding.

## 2015-06-07 LAB — STREP THROAT SCREEN: Strep Screen: NEGATIVE

## 2015-06-19 ENCOUNTER — Inpatient Hospital Stay
Admit: 2015-06-19 | Discharge: 2015-06-19 | Disposition: A | Discharge status: Home / Self Care | Payer: Self-pay | Attending: Emergency Medicine

## 2015-06-19 DIAGNOSIS — K0381 Cracked tooth: Secondary | ICD-10-CM

## 2015-06-19 MED ORDER — TRAMADOL 50 MG TAB
50 mg | ORAL_TABLET | Freq: Four times a day (QID) | ORAL | 0 refills | Status: AC | PRN
Start: 2015-06-19 — End: ?

## 2015-06-19 MED ORDER — CLINDAMYCIN 150 MG CAP
150 mg | ORAL_CAPSULE | Freq: Four times a day (QID) | ORAL | 0 refills | Status: AC
Start: 2015-06-19 — End: 2015-06-29

## 2015-06-19 MED ORDER — DIPHENHYDRAMINE 12.5 MG/5 ML ELIXIR
12.5 mg/5 mL | ORAL | Status: AC
Start: 2015-06-19 — End: 2015-06-19
  Administered 2015-06-19: 21:00:00 via ORAL

## 2015-06-19 MED ORDER — LIDOCAINE 2 % MUCOSAL SOLN
2 % | Status: AC
Start: 2015-06-19 — End: 2015-06-19
  Administered 2015-06-19: 21:00:00 via OROMUCOSAL

## 2015-06-19 MED FILL — DIPHENHYDRAMINE 12.5 MG/5 ML ELIXIR: 12.5 mg/5 mL | ORAL | Qty: 10

## 2015-06-19 MED FILL — LIDOCAINE VISCOUS 2 % MUCOSAL SOLUTION: 2 % | Qty: 15

## 2015-06-19 NOTE — ED Notes (Signed)
Patient armband removed and shredded

## 2015-06-19 NOTE — ED Provider Notes (Signed)
HPI: 06/19/2015, 4:07 PM    Carolyn Lamb is a 45 y.o. female c/o dental pain. It hurts to the right upper part of the jaw and face. No fevers or swelling. Pain is moderate without radiation.  Her illness developed gradually, with symptoms starting 3 days ago. Part of existing filling fell out and she cracked tooth after biting into hard candy. [place] of [place]. There is known dental disease. Is a former smoker.  Additionally this patient denies any earache, sore throat, vomiting, diarrhea, or eye problems.    ROS  Constitutional: No chills  CV: No chest pain  Resp: No dyspnea  GI: No abd pain  All other systems negative    Past Medical and Surgical History  Past Medical History   Diagnosis Date   ??? Hypothyroid    ??? Kidney stone    ??? Pneumonia      Past Surgical History   Procedure Laterality Date   ??? Hx thyroidectomy     ??? Hx cesarean section     ??? Hx hysterectomy     ??? Hx shoulder arthroscopy         If the past medical or past surgical history sections read "No history on file" it reflects documentation that the histories were verbally investigated but the patient denies any past medical or surgical history.     Medications  No current facility-administered medications on file prior to encounter.      Current Outpatient Prescriptions on File Prior to Encounter   Medication Sig Dispense Refill   ??? cetirizine (ZYRTEC) 10 mg tablet Take  by mouth.     ??? ibuprofen (ADVIL) 200 mg tablet Take  by mouth.     ??? levothyroxine (SYNTHROID) 100 mcg tablet Take 100 mcg by mouth Daily (before breakfast).         Allergies  Allergies   Allergen Reactions   ??? Flagyl [Metronidazole] Hives       PHYSICAL EXAM:    Patient Vitals for the past 12 hrs:   Temp Pulse Resp BP SpO2   06/19/15 1553 97.9 ??F (36.6 ??C) 96 20 129/82 100 %       CONSTITUTIONAL: Well developed, well nourished.  Awake & alert.  No acute distress, non-toxic in appearance, not diaphoretic.     EYES: EOMI. Scleral nml appearing.      NOSE:  There is no nasal congestion. Sinuses are non-tender.     THROAT: Posterior pharynx shows no erythema or exudates.  Rt upper 2nd premolar w/ partially dislodged filling and crack. Very tender. No swelling or sign of abscess. No evidence of Ludwig's angina. There is some missing dentition. There is some decayed dentition. No trismus. Secretions controlled. No stridor. Floor is soft.    NECK:  There is no  appreciable cervical lymphadenopathy.  Neck is supple without meningismus. There is no brawny edema.    PULMONARY:  Patient is speaking in full sentences without accessory muscle usage.      MUSCULOSKELETAL:  No peripheral edema.    SKIN:  Skin is warm and dry.  No diaphoresis, rashes, or lesions.      NEUROLOGICAL: Alert, awake, and appropriately oriented.  Normal speech.    MEDICAL DECISION MAKING  Differential Diagnosis: caries, abscess, fracture, gingivitis, ANUG, Ludwig's, PTA, RPA, DSB, work note    Medications   diphenhydrAMINE (BENADRYL) 12.5 mg/5 mL oral elixir 25 mg (not administered)   lidocaine (XYLOCAINE) 2 % viscous solution 15 mL (  not administered)     Based on symptomatology and exam, this patient likely has a toothache due to dental caries. There is no sign of abscess on exam today. I will treat with pain medication (topical dental balls and oral analgesia) and antibiotics. The importance of close dental follow up was stressed to her. She agrees to return to the ED if symptoms worsen.    I have reviewed the plan for treatment with this patient, and she agrees with the plan as discussed.  I have answered her questions and she verbalizes understanding of the discharge instructions.  She feels comfortable going home at this time, to follow up as below, and to return to the ED for any questions or concerns.    Discharge Instructions:  Please return to the ED for any increasing shortness of breath, swelling to jaw, difficulty swallowing, or any  persistent nausea, vomiting, or diarrhea.  Additionally return for any new or worsening symptoms as well as any persisting complaints. Follow your discharge instructions and take any prescription medication as directed.   Follow up with the healthcare referral we provided in the time period specified, and in the meantime please contact or return to the ED for any questions or concerns.     CLINICAL IMPRESSION  1. Cracked tooth    2. Odontalgia        Current Discharge Medication List      START taking these medications    Details   clindamycin (CLEOCIN) 150 mg capsule Take 2 Caps by mouth every six (6) hours for 10 days.  Qty: 80 Cap, Refills: 0      traMADol (ULTRAM) 50 mg tablet Take 1 Tab by mouth every six (6) hours as needed for Pain. Max Daily Amount: 200 mg.  Qty: 20 Tab, Refills: 0         CONTINUE these medications which have NOT CHANGED    Details   cetirizine (ZYRTEC) 10 mg tablet Take  by mouth.      ibuprofen (ADVIL) 200 mg tablet Take  by mouth.      levothyroxine (SYNTHROID) 100 mcg tablet Take 100 mcg by mouth Daily (before breakfast).             Follow-up Information     Follow up With Details Comments Contact Info    DENTIST Go to For follow up with your dentist.     [facility] Go to As needed, If symptoms worsen. [address]  [place] [id number]  [phone]          Attestations:  This note is prepared by Joeseph Amororothy Requina, acting as Scribe for [employer] T[employer], PA-C.    Ladona HornsMeghan Shatonya Passon, PA-C: The scribe's documentation has been prepared under my direction and personally reviewed by me in its entirety. I confirm that the note above accurately reflects all work, treatment, procedures, and medical decision making performed by me.

## 2015-06-19 NOTE — ED Triage Notes (Signed)
Patient with complaints of a toothache to the right lower tooth.

## 2015-06-21 ENCOUNTER — Inpatient Hospital Stay
Admit: 2015-06-21 | Discharge: 2015-06-21 | Disposition: A | Discharge status: Home / Self Care | Payer: Self-pay | Attending: Internal Medicine

## 2015-06-21 DIAGNOSIS — K0889 Other specified disorders of teeth and supporting structures: Secondary | ICD-10-CM

## 2015-06-21 MED ORDER — ACETAMINOPHEN-CODEINE 300 MG-30 MG TAB
300-30 mg | ORAL_TABLET | ORAL | 0 refills | Status: AC | PRN
Start: 2015-06-21 — End: ?

## 2015-06-21 MED ORDER — ONDANSETRON 4 MG TAB, RAPID DISSOLVE
4 mg | ORAL_TABLET | Freq: Three times a day (TID) | ORAL | 0 refills | Status: AC | PRN
Start: 2015-06-21 — End: ?

## 2015-06-21 NOTE — ED Triage Notes (Signed)
Patient reports she was seen here on Friday for a cracked tooth. States they gave her tramadol but it is not helping patient crying in triage.

## 2015-06-21 NOTE — ED Notes (Signed)
I have reviewed discharge instructions with the patient.  The patient verbalized understanding.

## 2015-06-21 NOTE — ED Provider Notes (Signed)
HPI Comments:   1:50 PM     Carolyn Lamb is a 45 y.o. female who presents to ED c/o right upper dental pain onset few days ago. Pt was evaluated in this facility 2 days ago (06/19/15) for cracked tooth and was Rx'd Cleocin as well as Tramadol. Pt reports the given pain medications she was given made her vomit. She states she called her dentist after she was evaluated 2 days ago and has an appointment tomorrow but was instructed to be seen in the ED today. Pt denies any other symptoms or complaints.         Patient is a 45 y.o. female presenting with dental problem. The history is provided by the patient. No language interpreter was used.   Dental Pain    This is a new problem. The problem occurs constantly. The problem has not changed since onset.         Past Medical History:   Diagnosis Date   ??? Hypothyroid    ??? Kidney stone    ??? Pneumonia        Past Surgical History:   Procedure Laterality Date   ??? Hx thyroidectomy     ??? Hx cesarean section     ??? Hx hysterectomy     ??? Hx shoulder arthroscopy           History reviewed. No pertinent family history.    Social History     Social History   ??? Marital status: SINGLE     Spouse name: N/A   ??? Number of children: N/A   ??? Years of education: N/A     Occupational History   ??? Not on file.     Social History Main Topics   ??? Smoking status: Former Smoker     Packs/day: 0.50     Quit date: 02/03/2015   ??? Smokeless tobacco: Not on file   ??? Alcohol use Yes      Comment: rare   ??? Drug use: No   ??? Sexual activity: Not on file     Other Topics Concern   ??? Not on file     Social History Narrative         ALLERGIES: Flagyl [metronidazole]    Review of Systems   HENT: Positive for dental problem (right upper).    All other systems reviewed and are negative.      Vitals:    06/21/15 1345   BP: 125/73   Pulse: 88   Resp: 16   Temp: 98.1 ??F (36.7 ??C)   SpO2: 100%   Weight: 55.3 kg (122 lb)   Height: 5' (1.524 m)            Physical Exam    Constitutional: She is oriented to person, place, and time. She appears well-developed and well-nourished.   Tearful, able to one & close mouth easily, no facial swelling noted   HENT:   Head: Normocephalic and atraumatic.   Nose: Nose normal.   Mouth/Throat: Uvula is midline, oropharynx is clear and moist and mucous membranes are normal. No oral lesions. No trismus in the jaw. Abnormal dentition. No dental abscesses or uvula swelling.       Eyes: Conjunctivae and EOM are normal. Pupils are equal, round, and reactive to light.   Neck: Normal range of motion. Neck supple.   Cardiovascular: Normal rate and regular rhythm.    Pulmonary/Chest: Effort normal and breath sounds normal.   Musculoskeletal: Normal range of  motion.   Lymphadenopathy:     She has no cervical adenopathy.   Neurological: She is alert and oriented to person, place, and time.   Skin: Skin is warm and dry.   Psychiatric: She has a normal mood and affect. Her behavior is normal.   Nursing note and vitals reviewed.     RESULTS:    No orders to display        Labs Reviewed - No data to display    No results found for this or any previous visit (from the past 12 hour(s)).     MDM  ED Course     MEDICATIONS GIVEN:  Medications - No data to display     Procedures    PROGRESS NOTE:  1:50 PM   Initial assessment performed.      DISCHARGE NOTE:  1:55 PM   Carolyn Lamb  results have been reviewed with her.  She has been counseled regarding her diagnosis, treatment, and plan.  She verbally conveys understanding and agreement of the signs, symptoms, diagnosis, treatment and prognosis and additionally agrees to follow up as discussed.  She also agrees with the care-plan and conveys that all of her questions have been answered.  I have also provided discharge instructions for her that include: educational information regarding their diagnosis and treatment, and list of reasons why they would want to return to the ED  prior to their follow-up appointment, should her condition change. The patient and/or family has been provided with education for proper Emergency Department utilization.    CLINICAL IMPRESSION:    1. Pain, dental        PLAN: DISCHARGE HOME    Follow-up Information     Follow up With Details Comments Contact Info    Dentist  Go to Go to your appointment as scheduled     [facility] EMERGENCY DEPT  As needed, If symptoms worsen [address]  [place] [id number]  [phone]          Current Discharge Medication List      START taking these medications    Details   acetaminophen-codeine (TYLENOL-CODEINE #3) 300-30 mg per tablet Take 1 Tab by mouth every four (4) hours as needed for Pain. Max Daily Amount: 6 Tabs.  Qty: 20 Tab, Refills: 0      ondansetron (ZOFRAN ODT) 4 mg disintegrating tablet Take 1 Tab by mouth every eight (8) hours as needed for Nausea.  Qty: 15 Tab, Refills: 0         CONTINUE these medications which have NOT CHANGED    Details   clindamycin (CLEOCIN) 150 mg capsule Take 2 Caps by mouth every six (6) hours for 10 days.  Qty: 80 Cap, Refills: 0      traMADol (ULTRAM) 50 mg tablet Take 1 Tab by mouth every six (6) hours as needed for Pain. Max Daily Amount: 200 mg.  Qty: 20 Tab, Refills: 0      cetirizine (ZYRTEC) 10 mg tablet Take  by mouth.      ibuprofen (ADVIL) 200 mg tablet Take  by mouth.      levothyroxine (SYNTHROID) 100 mcg tablet Take 100 mcg by mouth Daily (before breakfast).             ATTESTATIONS:  This note is prepared by Marchia Meiers, acting as Scribe for [employer], PA-C .    Synthia Innocent, PA-C: The scribe's documentation has been prepared under my direction and personally reviewed  by me in its entirety. I confirm that the note above accurately reflects all work, treatment, procedures, and medical decision making performed by me.

## 2016-03-17 ENCOUNTER — Emergency Department: Payer: Self-pay

## 2016-03-17 ENCOUNTER — Encounter: Payer: Self-pay | Admitting: Emergency Medicine

## 2016-03-17 ENCOUNTER — Emergency Department
Admission: EM | Admit: 2016-03-17 | Discharge: 2016-03-17 | Disposition: A | Discharge status: Home / Self Care | Payer: Self-pay | Attending: Emergency Medicine | Admitting: Emergency Medicine

## 2016-03-17 DIAGNOSIS — Z8543 Personal history of malignant neoplasm of ovary: Secondary | ICD-10-CM | POA: Insufficient documentation

## 2016-03-17 DIAGNOSIS — J4521 Mild intermittent asthma with (acute) exacerbation: Secondary | ICD-10-CM | POA: Insufficient documentation

## 2016-03-17 DIAGNOSIS — F17210 Nicotine dependence, cigarettes, uncomplicated: Secondary | ICD-10-CM | POA: Insufficient documentation

## 2016-03-17 HISTORY — DX: Pneumonia, unspecified organism: J18.9

## 2016-03-17 HISTORY — DX: Unspecified chronic bronchitis: J42

## 2016-03-17 HISTORY — DX: Malignant neoplasm of unspecified ovary: C56.9

## 2016-03-17 MED ORDER — ALBUTEROL SULFATE HFA 108 (90 BASE) MCG/ACT IN AERS: 2.0000 | INHALATION_SPRAY | Freq: Four times a day (QID) | RESPIRATORY_TRACT | 2 refills | Status: DC | PRN

## 2016-03-17 MED ORDER — PREDNISONE 20 MG PO TABS
60.0000 mg | ORAL_TABLET | Freq: Once | ORAL | Status: AC
Start: 2016-03-17 — End: 2016-03-17
  Administered 2016-03-17: 60 mg via ORAL

## 2016-03-17 MED ORDER — PREDNISONE 10 MG PO TABS: ORAL_TABLET | ORAL | 0 refills | Status: DC

## 2016-03-17 MED ORDER — IPRATROPIUM-ALBUTEROL 0.5-2.5 (3) MG/3ML IN SOLN
3.0000 mL | Freq: Once | RESPIRATORY_TRACT | Status: AC
  Administered 2016-03-17: 3 mL via RESPIRATORY_TRACT
  Filled 2016-03-17: qty 3

## 2016-03-17 MED ORDER — IPRATROPIUM-ALBUTEROL 0.5-2.5 (3) MG/3ML IN SOLN
RESPIRATORY_TRACT | Status: AC
  Filled 2016-03-17: qty 3

## 2016-03-17 MED ORDER — PREDNISONE 20 MG PO TABS
ORAL_TABLET | ORAL | Status: AC
  Filled 2016-03-17: qty 3

## 2016-03-17 MED ORDER — IPRATROPIUM-ALBUTEROL 0.5-2.5 (3) MG/3ML IN SOLN
3.0000 mL | Freq: Once | RESPIRATORY_TRACT | Status: AC
  Administered 2016-03-17: 3 mL via RESPIRATORY_TRACT

## 2016-03-17 NOTE — Discharge Instructions (Signed)
Use albuterol 2 puffs 4 times a day as needed for wheezing. Continue prednisone as directed beginning tomorrow as you have had your first dose in the emergency room. A list of clinics is listed on your discharge papers. Call to see if he is taken the patient's.

## 2016-03-17 NOTE — ED Triage Notes (Signed)
Says sob since she woke this am.  Cough, nasal congestion.  Non prod cough.

## 2016-03-17 NOTE — ED Provider Notes (Signed)
EKG interpreted by me Normal sinus rhythm rate of 89, normal axis intervals QRS ST segments. Slight T-wave inversions in V2-V4 with non-ischemic morphology.   Carrie Mew, MD 03/17/16 1150

## 2016-03-17 NOTE — ED Provider Notes (Signed)
[facility] Emergency Department Provider Note  ___________________________________________   First MD Initiated Contact with Patient 03/17/16 [phone]     (approximate)  I have reviewed the triage vital signs and the nursing notes.   HISTORY  Chief Complaint Shortness of Breath   HPI Rachael Miller is a 45 y.o. female is here with complaint of shortness of breath. Patient states she has history of asthma and in the past has been using an inhaler. Patient moved here from [place] and does not have primary care doctor. She has not had her inhaler for many months but states she has not needed it. But the weather changing this one she woke up with shortness of breath and wheezing. She also has a cough that is nonproductive and nasal congestion. She denies any fever or chills. She rates her pain as 8 out of 10.   Past Medical History:  Diagnosis Date  . Chronic bronchitis ([place])   . Ovarian cancer ([place])   . Pneumonia     There are no active problems to display for this patient.   Past Surgical History:  Procedure Laterality Date  . HYSTEROTOMY    . THYROIDECTOMY      Prior to Admission medications   Medication Sig Start Date End Date Taking? Authorizing Provider  albuterol (PROVENTIL HFA;VENTOLIN HFA) 108 (90 Base) MCG/ACT inhaler Inhale 2 puffs into the lungs every 6 (six) hours as needed for wheezing or shortness of breath. 03/17/16   Johnn Hai, PA-C  predniSONE (DELTASONE) 10 MG tablet Take 5 tablets tomorrow, on day 2 take 4 tablets, day 3 take 3 tablets, day 4 take 2 tablets, day 5 take  1 tablets. 03/17/16   Johnn Hai, PA-C    Allergies Review of patient's allergies indicates not on file.  No family history on file.  Social History Social History  Substance Use Topics  . Smoking status: Current Some Day Smoker  . Smokeless tobacco: Never Used  . Alcohol use No    Review of Systems Constitutional: No  fever/chills Eyes: No visual changes. ENT: Positive nasal congestion. Cardiovascular: Denies chest pain. Respiratory: Positive shortness of breath and wheezing. Gastrointestinal: No abdominal pain.  No nausea, no vomiting.   Musculoskeletal: Negative for back pain. Skin: Negative for rash. Neurological: Negative for headaches, focal weakness or numbness.  10-point ROS otherwise negative.  ____________________________________________   PHYSICAL EXAM:  VITAL SIGNS: ED Triage Vitals  Enc Vitals Group     BP 03/17/16 0853 (!) 128/95     Pulse Rate 03/17/16 0853 91     Resp 03/17/16 0853 (!) 22     Temp 03/17/16 0853 98 F (36.7 C)     Temp Source 03/17/16 0853 Oral     SpO2 03/17/16 0853 98 %     Weight 03/17/16 0853 117 lb (53.1 kg)     Height 03/17/16 0853 5\' 4"  (1.626 m)     Head Circumference --      Peak Flow --      Pain Score 03/17/16 0902 8     Pain Loc --      Pain Edu? --      Excl. in [place]? --     Constitutional: Alert and oriented. Well appearing and in no acute distress. Eyes: Conjunctivae are normal. PERRL. EOMI. Head: Atraumatic. Nose: Mild congestion/no rhinnorhea.  EACs are clear bilaterally. TMs are dull bilaterally. Mouth/Throat: Mucous membranes are moist.  Oropharynx non-erythematous. Mild posterior drainage noted. Neck: No  stridor.   Hematological/Lymphatic/Immunilogical: No cervical lymphadenopathy. Cardiovascular: Normal rate, regular rhythm. Grossly normal heart sounds.  Good peripheral circulation. Respiratory: Normal respiratory effort.  No retractions. Lungs CTAB. Gastrointestinal: Soft and nontender. No distention.  Musculoskeletal: Moves upper and lower extremities without any difficulty. Neurologic:  Normal speech and language. No gross focal neurologic deficits are appreciated. No gait instability. Skin:  Skin is warm, dry and intact. No rash noted. Psychiatric: Mood and affect are normal. Speech and behavior are  normal.  ____________________________________________   LABS (all labs ordered are listed, but only abnormal results are displayed)  Labs Reviewed - No data to display ____________________________________________  EKG  Per Dr. Joni Fears ____________________________________________  RADIOLOGY  Chest x-ray per radiologist shows no active cardiopulmonary disease. I, Johnn Hai, personally viewed and evaluated these images (plain radiographs) as part of my medical decision making, as well as reviewing the written report by the radiologist. ____________________________________________   PROCEDURES  Procedure(s) performed: None  Procedures  Critical Care performed: No  ____________________________________________   INITIAL IMPRESSION / ASSESSMENT AND PLAN / ED COURSE  Pertinent labs & imaging results that were available during my care of the patient were reviewed by me and considered in my medical decision making (see chart for details).    Clinical Course   ----------------------------------------- 11:14 AM on 03/17/2016 ----------------------------------------- Improved breathing and patient feels much better.  Patient is greatly improved is not having any difficulty breathing. Patient was given a prescription for albuterol inhaler. She is also to continue taking prednisone tapering dose. Patient has nebulizer machine at home along with solution. She'll follow-up with [facility] clinic if any continued problems. She is also given a list of clinics in the area that use a sliding scale for office these.   ____________________________________________   FINAL CLINICAL IMPRESSION(S) / ED DIAGNOSES  Final diagnoses:  Mild intermittent asthma with exacerbation      NEW MEDICATIONS STARTED DURING THIS VISIT:  Discharge Medication List as of 03/17/2016 11:26 AM    START taking these medications   Details  albuterol (PROVENTIL HFA;VENTOLIN HFA) 108 (90 Base)  MCG/ACT inhaler Inhale 2 puffs into the lungs every 6 (six) hours as needed for wheezing or shortness of breath., Starting Thu 03/17/2016, Print    predniSONE (DELTASONE) 10 MG tablet Take 5 tablets tomorrow, on day 2 take 4 tablets, day 3 take 3 tablets, day 4 take 2 tablets, day 5 take  1 tablets., Print         Note:  This document was prepared using Dragon voice recognition software and may include unintentional dictation errors.    Johnn Hai, PA-C 03/17/16 [place] K[place], PA-C 03/17/16 1457    Carrie Mew, MD 03/17/16 [phone]

## 2016-04-03 ENCOUNTER — Other Ambulatory Visit: Payer: Self-pay | Admitting: Internal Medicine

## 2016-04-03 DIAGNOSIS — J4520 Mild intermittent asthma, uncomplicated: Secondary | ICD-10-CM | POA: Insufficient documentation

## 2016-04-12 ENCOUNTER — Encounter: Payer: Self-pay | Admitting: Internal Medicine

## 2016-04-12 ENCOUNTER — Ambulatory Visit (INDEPENDENT_AMBULATORY_CARE_PROVIDER_SITE_OTHER): Payer: Self-pay | Admitting: Internal Medicine

## 2016-04-12 VITALS — BP 110/80 | HR 66 | Ht 60.0 in | Wt 127.0 lb

## 2016-04-12 DIAGNOSIS — Z23 Encounter for immunization: Secondary | ICD-10-CM

## 2016-04-12 DIAGNOSIS — Z8543 Personal history of malignant neoplasm of ovary: Secondary | ICD-10-CM

## 2016-04-12 DIAGNOSIS — F17200 Nicotine dependence, unspecified, uncomplicated: Secondary | ICD-10-CM | POA: Insufficient documentation

## 2016-04-12 DIAGNOSIS — J4520 Mild intermittent asthma, uncomplicated: Secondary | ICD-10-CM

## 2016-04-12 DIAGNOSIS — E890 Postprocedural hypothyroidism: Secondary | ICD-10-CM | POA: Insufficient documentation

## 2016-04-12 MED ORDER — BUPROPION HCL ER (SR) 150 MG PO TB12: 150.0000 mg | ORAL_TABLET | Freq: Two times a day (BID) | ORAL | 3 refills | Status: DC

## 2016-04-12 MED ORDER — LEVOTHYROXINE SODIUM 112 MCG PO TABS: 112.0000 ug | ORAL_TABLET | Freq: Every day | ORAL | 1 refills | Status: DC

## 2016-04-12 NOTE — Progress Notes (Signed)
Date:  04/12/2016   Name:  Rachael Miller   DOB:  04/20/1971   MRN:  [id number]   Chief Complaint: Establish Care and Thyroid Problem (needs levothyroxine filled. been out for a month and has had a thyroidectomy.) Thyroid Problem  Presents for follow-up visit. Symptoms include depressed mood, fatigue and hoarse voice. Patient reports no constipation or palpitations. (S/p thyroidectomy and needs meds refilled) The symptoms have been worsening.  Asthma  She complains of hoarse voice. There is no shortness of breath or wheezing. The problem occurs intermittently. Pertinent negatives include no chest pain or headaches. Her symptoms are aggravated by change in weather and URI. Her symptoms are alleviated by beta-agonist. She reports complete improvement on treatment. Risk factors for lung disease include smoking/tobacco exposure. Her past medical history is significant for asthma.   Tobacco use - has smoked for many years but quit intermittently on several occasions.  She really wants to quit now - gets chronic bronchitis and has asthma as well.  Ovarian Cancer - had ovarian cancer 10 years ago.  Treated with total hysterectomy and chemotherapy. Has been seeing GYN annually and Oncology every 6 months.  Review of Systems  Constitutional: Positive for fatigue and unexpected weight change (6 lbs since being off levothyroxine). Negative for chills.  HENT: Positive for hoarse voice.   Respiratory: Negative for chest tightness, shortness of breath and wheezing.   Cardiovascular: Negative for chest pain, palpitations and leg swelling.  Gastrointestinal: Negative for abdominal pain and constipation.  Skin: Negative for color change and rash.  Allergic/Immunologic: Negative for environmental allergies.  Neurological: Negative for dizziness and headaches.  Hematological: Negative for adenopathy.  Psychiatric/Behavioral: Negative for dysphoric mood and sleep disturbance.    Patient Active  Problem List   Diagnosis Date Noted  . Asthma in adult, mild intermittent, uncomplicated [id number]    Prior to Admission medications   Medication Sig Start Date End Date Taking? Authorizing Provider  albuterol (PROVENTIL HFA;VENTOLIN HFA) 108 (90 Base) MCG/ACT inhaler Inhale 2 puffs into the lungs every 6 (six) hours as needed for wheezing or shortness of breath. 03/17/16  Yes Johnn Hai, PA-C  levothyroxine (SYNTHROID, LEVOTHROID) 112 MCG tablet Take 112 mcg by mouth daily before breakfast. Dr. Noreene Filbert- [facility]   Yes Historical Provider, MD    Allergies  Allergen Reactions  . Flagyl [Metronidazole] Hives    Past Surgical History:  Procedure Laterality Date  . CESAREAN SECTION    . HYSTEROTOMY    . THYROIDECTOMY      Social History  Substance Use Topics  . Smoking status: Current Some Day Smoker  . Smokeless tobacco: Never Used  . Alcohol use No     Medication list has been reviewed and updated.   Physical Exam  Constitutional: She is oriented to person, place, and time. She appears well-developed. No distress.  HENT:  Head: Normocephalic and atraumatic.  Neck: Normal range of motion. Carotid bruit is not present. No thyroid mass (surgical scar healed) present.  Cardiovascular: Normal rate, regular rhythm and normal heart sounds.   Pulmonary/Chest: Effort normal and breath sounds normal. No respiratory distress. She has no wheezes.  Musculoskeletal: Normal range of motion.  Lymphadenopathy:    She has no cervical adenopathy.  Neurological: She is alert and oriented to person, place, and time.  Skin: Skin is warm, dry and intact. No rash noted.  Psychiatric: She has a normal mood and affect. Her speech is normal and behavior is normal. Thought content  normal.  Nursing note and vitals reviewed.   BP 110/80   Pulse 66   Ht 5' (1.524 m)   Wt 127 lb (57.6 kg)   BMI 24.80 kg/m   Assessment and Plan: 1. Postoperative hypothyroidism Resume supplementation  and recheck labs 3-4 months - levothyroxine (SYNTHROID, LEVOTHROID) 112 MCG tablet; Take 1 tablet (112 mcg total) by mouth daily.  Dispense: 90 tablet; Refill: 1  2. Tobacco use disorder Options discussed - will try bupropion - buPROPion (WELLBUTRIN SR) 150 MG 12 hr tablet; Take 1 tablet (150 mg total) by mouth 2 (two) times daily.  Dispense: 60 tablet; Refill: 3  3. Hx of ovarian cancer Will refer to GYN and Onc - appointments after Jan 1 - Ambulatory referral to Gynecology - Ambulatory referral to Oncology  4. Need for influenza vaccination - Flu Vaccine QUAD 36+ mos IM  5. Asthma in adult, mild intermittent, uncomplicated Continue albuterol as needed   Halina Maidens, MD [facility]  04/12/2016

## 2016-06-04 ENCOUNTER — Emergency Department
Admission: EM | Admit: 2016-06-04 | Discharge: 2016-06-04 | Disposition: A | Discharge status: Home / Self Care | Payer: Self-pay | Attending: Emergency Medicine | Admitting: Emergency Medicine

## 2016-06-04 ENCOUNTER — Encounter: Payer: Self-pay | Admitting: *Deleted

## 2016-06-04 DIAGNOSIS — Z8543 Personal history of malignant neoplasm of ovary: Secondary | ICD-10-CM | POA: Insufficient documentation

## 2016-06-04 DIAGNOSIS — J111 Influenza due to unidentified influenza virus with other respiratory manifestations: Secondary | ICD-10-CM

## 2016-06-04 DIAGNOSIS — R0981 Nasal congestion: Secondary | ICD-10-CM | POA: Insufficient documentation

## 2016-06-04 DIAGNOSIS — R69 Illness, unspecified: Secondary | ICD-10-CM

## 2016-06-04 DIAGNOSIS — R05 Cough: Secondary | ICD-10-CM | POA: Insufficient documentation

## 2016-06-04 DIAGNOSIS — Z79899 Other long term (current) drug therapy: Secondary | ICD-10-CM | POA: Insufficient documentation

## 2016-06-04 DIAGNOSIS — J45909 Unspecified asthma, uncomplicated: Secondary | ICD-10-CM | POA: Insufficient documentation

## 2016-06-04 DIAGNOSIS — E039 Hypothyroidism, unspecified: Secondary | ICD-10-CM | POA: Insufficient documentation

## 2016-06-04 DIAGNOSIS — F17200 Nicotine dependence, unspecified, uncomplicated: Secondary | ICD-10-CM | POA: Insufficient documentation

## 2016-06-04 LAB — INFLUENZA PANEL BY PCR (TYPE A & B)
Influenza A By PCR: NEGATIVE
Influenza B By PCR: NEGATIVE

## 2016-06-04 MED ORDER — OSELTAMIVIR PHOSPHATE 75 MG PO CAPS: 75.0000 mg | ORAL_CAPSULE | Freq: Two times a day (BID) | ORAL | 0 refills | Status: AC

## 2016-06-04 NOTE — ED Triage Notes (Signed)
States generalized body aches and cough for 3 days

## 2016-06-04 NOTE — ED Notes (Signed)
See triage note  States she body aches and congestion for the past 3 days   Cough is prod    Cough is prod

## 2016-06-04 NOTE — ED Notes (Signed)
Pt alert and oriented X4, active, cooperative, pt in NAD. RR even and unlabored, color WNL.  Pt informed to return if any life threatening symptoms occur.   

## 2016-06-04 NOTE — ED Notes (Signed)
FIRST NURSE NOTE: body aches, congestion for the past few days. Been around others who have been sick.

## 2016-06-05 NOTE — ED Provider Notes (Signed)
[facility] Emergency Department Provider Note  ____________________________________________  Time seen: Approximately 2:25 PM  I have reviewed the triage vital signs and the nursing notes.   HISTORY  Chief Complaint Generalized Body Aches    HPI  Rachael Miller is a 45 y.o. female presenting to the emergency department with generalized body aches, congestion and myalgias for the past 3 days. Patient states that she has been coughing intermittently. Patient states the cough is nonproductive and is not disturbing her sleep. No purulent sputum production. She denies fever. Patient denies chest pain, chest tightness, shortness of breath, abdominal pain, nausea, vomiting, diarrhea or constipation. Patient has tried Tylenol and ibuprofen, which have minimally relieved her symptoms. Patient is a Freight forwarder for a [employer], occupational. Several of her staff members had experienced similar symptoms. Her symptoms have been as severe enough for her to miss work. No recent travel. Patient has been afebrile.   Past Medical History:  Diagnosis Date  . Chronic bronchitis ([place])   . History of thyroidectomy    right voice cord was removed during surgery  . Ovarian cancer ([place])    ovarion  . Pneumonia     Patient Active Problem List   Diagnosis Date Noted  . Hx of ovarian cancer 04/12/2016  . Tobacco use disorder 04/12/2016  . 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 Asthma in adult, mild intermittent, uncomplicated [id number]    Past Surgical History:  Procedure Laterality Date  . CESAREAN SECTION    . THYROIDECTOMY  2007   goiter  . TOTAL VAGINAL HYSTERECTOMY  2000   ovarian cancer    Prior to Admission medications   Medication Sig Start Date End Date Taking? Authorizing Provider  albuterol (PROVENTIL HFA;VENTOLIN HFA) 108 (90 Base) MCG/ACT inhaler Inhale 2 puffs into the lungs every 6 (six) hours as needed for wheezing or shortness of breath. 03/17/16    Johnn Hai, PA-C  buPROPion (WELLBUTRIN SR) 150 MG 12 hr tablet Take 1 tablet (150 mg total) by mouth 2 (two) times daily. 04/12/16   Glean Hess, MD  levothyroxine (SYNTHROID, LEVOTHROID) 112 MCG tablet Take 1 tablet (112 mcg total) by mouth daily. 04/12/16   Glean Hess, MD  oseltamivir (TAMIFLU) 75 MG capsule Take 1 capsule (75 mg total) by mouth 2 (two) times daily. 06/04/16 06/09/16  Lannie Fields, PA-C    Allergies Flagyl [metronidazole]  Family History  Problem Relation Age of Onset  . Adopted: Yes    Social History Social History  Substance Use Topics  . Smoking status: Current Some Day Smoker    Packs/day: 1.00    Years: 30.00  . Smokeless tobacco: Never Used  . Alcohol use No     Review of Systems  Constitutional: No fever/chills Eyes: No visual changes. No discharge ENT: Has congestion and non-productive cough. Cardiovascular: no chest pain. Respiratory: No SOB. Gastrointestinal: No abdominal pain.  No nausea, no vomiting.  No diarrhea.  No constipation. Genitourinary: Negative for dysuria. No hematuria Musculoskeletal: Has myalgias.  Skin: Negative for rash. Neurological: Negative for headaches, focal weakness or numbness. 10-point ROS otherwise negative.  ____________________________________________   PHYSICAL EXAM:  VITAL SIGNS: ED Triage Vitals  Enc Vitals Group     BP 06/04/16 1312 (!) 125/94     Pulse Rate 06/04/16 1312 83     Resp 06/04/16 1312 18     Temp 06/04/16 1312 98.3 F (36.8 C)     Temp Source 06/04/16 1312 Oral  SpO2 06/04/16 1312 98 %     Weight 06/04/16 1313 127 lb (57.6 kg)     Height 06/04/16 1313 5' (1.524 m)     Head Circumference --      Peak Flow --      Pain Score 06/04/16 1313 7     Pain Loc --      Pain Edu? --      Excl. in [place]? --      Constitutional: Patient is lying supine in bed.  Eyes: Conjunctivae are normal. PERRL. EOMI. Head: Atraumatic. ENT:      Ears: Tympanic membranes are pearly  bilaterally. No erythema or signs of infection.      Nose: Nasal turbinates are edematous and erythematous bilaterally.      Mouth/Throat: Posterior pharynx is without erythema or tonsillar hypertrophy. Uvula is midline. Hematological/Lymphatic/Immunilogical: No cervical lymphadenopathy. Cardiovascular: Normal rate, regular rhythm. Normal S1 and S2.  Good peripheral circulation. No murmurs, gallops or rubs auscultated. Respiratory: Normal respiratory effort without tachypnea or retractions. Lungs CTAB. Good air entry to the bases with no decreased or absent breath sounds. Gastrointestinal: Bowel sounds 4 quadrants. Soft and nontender to palpation. No guarding or rigidity. No palpable masses. No distention. No CVA tenderness. Skin:  Skin is warm, dry and intact. No rash noted. ____________________________________________   LABS (all labs ordered are listed, but only abnormal results are displayed)  Labs Reviewed  INFLUENZA PANEL BY PCR (TYPE A & B, H1N1)   ____________________________________________  EKG   ____________________________________________  RADIOLOGY   No results found.  ____________________________________________    PROCEDURES  Procedure(s) performed:    Procedures    Medications - No data to display   ____________________________________________   INITIAL IMPRESSION / ASSESSMENT AND PLAN / ED COURSE  Pertinent labs & imaging results that were available during my care of the patient were reviewed by me and considered in my medical decision making (see chart for details).  Review of the [place] CSRS was performed in accordance of the [place] prior to dispensing any controlled drugs.  Clinical Course    Assessment and Plan:  Influenza-like illness: Patient presents with myalgias, congestion and acute nonproductive cough for the past 3 days. No purulent sputum production, excessive fatigue or shortness of breath. Chest x-ray is not warranted at this time.  Patient tested negative for influenza in the emergency department. Patient was discharged with Tamiflu. Patient education was provided regarding the course of a viral upper respiratory tract infection. Vital signs are reassuring at this time. All patient questions were answered. ____________________________________________  FINAL CLINICAL IMPRESSION(S) / ED DIAGNOSES  Final diagnoses:  Influenza-like illness      NEW MEDICATIONS STARTED DURING THIS VISIT:  Discharge Medication List as of 06/04/2016  2:46 PM    START taking these medications   Details  oseltamivir (TAMIFLU) 75 MG capsule Take 1 capsule (75 mg total) by mouth 2 (two) times daily., Starting Sat 06/04/2016, Until Thu 06/09/2016, Print            This chart was dictated using voice recognition software/Dragon. Despite best efforts to proofread, errors can occur which can change the meaning. Any change was purely unintentional.    Lannie Fields, PA-C 06/05/16 1433    Lisa Roca, MD 06/05/16 [phone]

## 2016-06-06 NOTE — Progress Notes (Deleted)
  [place]  Telephone:([phone] [phone]  ID: Rachael Miller OB: [birth year]  MR#: [id number]  [id number]  Patient Care Team: No Pcp Per Patient as PCP - General (General Practice)  CHIEF COMPLAINT: History of ovarian cancer.  INTERVAL HISTORY: ***  REVIEW OF SYSTEMS:   ROS  As per HPI. Otherwise, a complete review of systems is negative.  PAST MEDICAL HISTORY: Past Medical History:  Diagnosis Date  . Chronic bronchitis ([place])   . History of thyroidectomy    right voice cord was removed during surgery  . Ovarian cancer ([place])    ovarion  . Pneumonia     PAST SURGICAL HISTORY: Past Surgical History:  Procedure Laterality Date  . CESAREAN SECTION    . THYROIDECTOMY  2007   goiter  . TOTAL VAGINAL HYSTERECTOMY  2000   ovarian cancer    FAMILY HISTORY: Family History  Problem Relation Age of Onset  . Adopted: Yes    ADVANCED DIRECTIVES (Y/N):  N  HEALTH MAINTENANCE: Social History  Substance Use Topics  . Smoking status: Current Some Day Smoker    Packs/day: 1.00    Years: 30.00  . Smokeless tobacco: Never Used  . Alcohol use No     Colonoscopy:  PAP:  Bone density:  Lipid panel:  Allergies  Allergen Reactions  . Flagyl [Metronidazole] Hives    Current Outpatient Prescriptions  Medication Sig Dispense Refill  . albuterol (PROVENTIL HFA;VENTOLIN HFA) 108 (90 Base) MCG/ACT inhaler Inhale 2 puffs into the lungs every 6 (six) hours as needed for wheezing or shortness of breath. 1 Inhaler 2  . buPROPion (WELLBUTRIN SR) 150 MG 12 hr tablet Take 1 tablet (150 mg total) by mouth 2 (two) times daily. 60 tablet 3  . levothyroxine (SYNTHROID, LEVOTHROID) 112 MCG tablet Take 1 tablet (112 mcg total) by mouth daily. 90 tablet 1  . oseltamivir (TAMIFLU) 75 MG capsule Take 1 capsule (75 mg total) by mouth 2 (two) times daily. 10 capsule 0   No current facility-administered medications for this visit.      OBJECTIVE: There were no vitals filed for this visit.   There is no height or weight on file to calculate BMI.    ECOG FS:{CHL ONC [id number]  General: Well-developed, well-nourished, no acute distress. Eyes: Pink conjunctiva, anicteric sclera. HEENT: Normocephalic, moist mucous membranes, clear oropharnyx. Lungs: Clear to auscultation bilaterally. Heart: Regular rate and rhythm. No rubs, murmurs, or gallops. Abdomen: Soft, nontender, nondistended. No organomegaly noted, normoactive bowel sounds. Musculoskeletal: No edema, cyanosis, or clubbing. Neuro: Alert, answering all questions appropriately. Cranial nerves grossly intact. Skin: No rashes or petechiae noted. Psych: Normal affect. Lymphatics: No cervical, calvicular, axillary or inguinal LAD.   LAB RESULTS:  No results found for: NA, K, CL, CO2, GLUCOSE, BUN, CREATININE, CALCIUM, PROT, ALBUMIN, AST, ALT, ALKPHOS, BILITOT, GFRNONAA, GFRAA  No results found for: WBC, NEUTROABS, HGB, HCT, MCV, PLT   STUDIES: No results found.  ASSESSMENT: History of ovarian cancer.  PLAN:    1. History of ovarian cancer:  Patient expressed understanding and was in agreement with this plan. She also understands that She can call clinic at any time with any questions, concerns, or complaints.   No matching staging information was found for the patient.  Lloyd Huger, MD   06/06/2016 11:47 PM

## 2016-06-08 ENCOUNTER — Inpatient Hospital Stay: Payer: Self-pay | Admitting: Oncology

## 2016-06-23 ENCOUNTER — Encounter: Payer: Self-pay | Admitting: Obstetrics and Gynecology

## 2016-06-27 ENCOUNTER — Inpatient Hospital Stay: Payer: 59 | Attending: Oncology | Admitting: Oncology

## 2016-06-27 NOTE — Progress Notes (Deleted)
  [place]  Telephone:([phone] [phone]  ID: Rachael Miller OB: [birth year]  MR#: [id number]  [id number]  Patient Care Team: No Pcp Per Patient as PCP - General (General Practice)  CHIEF COMPLAINT: History of ovarian cancer.  INTERVAL HISTORY: ***  REVIEW OF SYSTEMS:   ROS  As per HPI. Otherwise, a complete review of systems is negative.  PAST MEDICAL HISTORY: Past Medical History:  Diagnosis Date  . Chronic bronchitis ([place])   . History of thyroidectomy    right voice cord was removed during surgery  . Ovarian cancer ([place])    ovarion  . Pneumonia     PAST SURGICAL HISTORY: Past Surgical History:  Procedure Laterality Date  . CESAREAN SECTION    . THYROIDECTOMY  2007   goiter  . TOTAL VAGINAL HYSTERECTOMY  2000   ovarian cancer    FAMILY HISTORY: Family History  Problem Relation Age of Onset  . Adopted: Yes    ADVANCED DIRECTIVES (Y/N):  N  HEALTH MAINTENANCE: Social History  Substance Use Topics  . Smoking status: Current Some Day Smoker    Packs/day: 1.00    Years: 30.00  . Smokeless tobacco: Never Used  . Alcohol use No     Colonoscopy:  PAP:  Bone density:  Lipid panel:  Allergies  Allergen Reactions  . Flagyl [Metronidazole] Hives    Current Outpatient Prescriptions  Medication Sig Dispense Refill  . albuterol (PROVENTIL HFA;VENTOLIN HFA) 108 (90 Base) MCG/ACT inhaler Inhale 2 puffs into the lungs every 6 (six) hours as needed for wheezing or shortness of breath. 1 Inhaler 2  . buPROPion (WELLBUTRIN SR) 150 MG 12 hr tablet Take 1 tablet (150 mg total) by mouth 2 (two) times daily. 60 tablet 3  . levothyroxine (SYNTHROID, LEVOTHROID) 112 MCG tablet Take 1 tablet (112 mcg total) by mouth daily. 90 tablet 1   No current facility-administered medications for this visit.     OBJECTIVE: There were no vitals filed for this visit.   There is no height or weight on file to calculate BMI.    ECOG  FS:{CHL ONC [id number]  General: Well-developed, well-nourished, no acute distress. Eyes: Pink conjunctiva, anicteric sclera. HEENT: Normocephalic, moist mucous membranes, clear oropharnyx. Lungs: Clear to auscultation bilaterally. Heart: Regular rate and rhythm. No rubs, murmurs, or gallops. Abdomen: Soft, nontender, nondistended. No organomegaly noted, normoactive bowel sounds. Musculoskeletal: No edema, cyanosis, or clubbing. Neuro: Alert, answering all questions appropriately. Cranial nerves grossly intact. Skin: No rashes or petechiae noted. Psych: Normal affect. Lymphatics: No cervical, calvicular, axillary or inguinal LAD.   LAB RESULTS:  No results found for: NA, K, CL, CO2, GLUCOSE, BUN, CREATININE, CALCIUM, PROT, ALBUMIN, AST, ALT, ALKPHOS, BILITOT, GFRNONAA, GFRAA  No results found for: WBC, NEUTROABS, HGB, HCT, MCV, PLT   STUDIES: No results found.  ASSESSMENT: History of ovarian cancer.  PLAN:    1. History of ovarian cancer:  Patient expressed understanding and was in agreement with this plan. She also understands that She can call clinic at any time with any questions, concerns, or complaints.   Cancer Staging No matching staging information was found for the patient.  Rachael Huger, MD   06/27/2016 10:35 AM

## 2016-06-30 ENCOUNTER — Encounter: Payer: Self-pay | Admitting: *Deleted

## 2016-06-30 ENCOUNTER — Emergency Department
Admission: EM | Admit: 2016-06-30 | Discharge: 2016-06-30 | Disposition: A | Discharge status: Home / Self Care | Payer: 59 | Attending: Emergency Medicine | Admitting: Emergency Medicine

## 2016-06-30 DIAGNOSIS — X500XXA Overexertion from strenuous movement or load, initial encounter: Secondary | ICD-10-CM | POA: Diagnosis not present

## 2016-06-30 DIAGNOSIS — Y999 Unspecified external cause status: Secondary | ICD-10-CM | POA: Insufficient documentation

## 2016-06-30 DIAGNOSIS — S3992XA Unspecified injury of lower back, initial encounter: Secondary | ICD-10-CM | POA: Diagnosis present

## 2016-06-30 DIAGNOSIS — Y92009 Unspecified place in unspecified non-institutional (private) residence as the place of occurrence of the external cause: Secondary | ICD-10-CM | POA: Diagnosis not present

## 2016-06-30 DIAGNOSIS — F17200 Nicotine dependence, unspecified, uncomplicated: Secondary | ICD-10-CM | POA: Insufficient documentation

## 2016-06-30 DIAGNOSIS — M5442 Lumbago with sciatica, left side: Secondary | ICD-10-CM | POA: Insufficient documentation

## 2016-06-30 DIAGNOSIS — Z8543 Personal history of malignant neoplasm of ovary: Secondary | ICD-10-CM | POA: Diagnosis not present

## 2016-06-30 DIAGNOSIS — Y9389 Activity, other specified: Secondary | ICD-10-CM | POA: Diagnosis not present

## 2016-06-30 DIAGNOSIS — E039 Hypothyroidism, unspecified: Secondary | ICD-10-CM | POA: Insufficient documentation

## 2016-06-30 DIAGNOSIS — M5432 Sciatica, left side: Secondary | ICD-10-CM

## 2016-06-30 MED ORDER — CYCLOBENZAPRINE HCL 5 MG PO TABS: 5.0000 mg | ORAL_TABLET | Freq: Three times a day (TID) | ORAL | 0 refills | Status: DC | PRN

## 2016-06-30 MED ORDER — KETOROLAC TROMETHAMINE 10 MG PO TABS: 10.0000 mg | ORAL_TABLET | Freq: Three times a day (TID) | ORAL | 0 refills | Status: DC

## 2016-06-30 MED ORDER — ONDANSETRON 4 MG PO TBDP: 4.0000 mg | ORAL_TABLET | Freq: Four times a day (QID) | ORAL | 0 refills | Status: DC | PRN

## 2016-06-30 MED ORDER — KETOROLAC TROMETHAMINE 60 MG/2ML IM SOLN
30.0000 mg | Freq: Once | INTRAMUSCULAR | Status: AC
  Administered 2016-06-30: 30 mg via INTRAMUSCULAR
  Filled 2016-06-30: qty 2

## 2016-06-30 NOTE — Discharge Instructions (Signed)
Your exam is essentially normal, there is no nerve injury or spinal injury. You may have some sciatic nerve irritation causing left lower leg pain. Take the prescription meds for pain and spasms as directed. Apply ice to the area for pain relief. Follow-up with Dr. Army Melia for continued symptoms.

## 2016-06-30 NOTE — ED Triage Notes (Signed)
Pt ambulatory to triage with lower back pain.  Pain radiates into left leg.  No known injury.  Pt taking advil without pain relief.

## 2016-06-30 NOTE — ED Provider Notes (Signed)
[facility] Emergency Department Provider Note ____________________________________________  Time seen: 1643  I have reviewed the triage vital signs and the nursing notes.  HISTORY  Chief Complaint  Back Pain  HPI [place] is a 46 y.o. female visits to the ED with onset of low back pain with radiation to the posterior left leg. She denies any injury, accident, fall. 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 She denies any history of chronic back pain or recurrent lower extremity sciatica. The patient has taken ibuprofen with limited benefit. She denies any distal paresthesias, foot drop, or incontinence.  Past Medical History:  Diagnosis Date  . Chronic bronchitis ([place])   . History of thyroidectomy    right voice cord was removed during surgery  . Ovarian cancer ([place])    ovarion  . Pneumonia     Patient Active Problem List   Diagnosis Date Noted  . Hx of ovarian cancer 04/12/2016  . Tobacco use disorder 04/12/2016  . 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 Asthma in adult, mild intermittent, uncomplicated [id number]    Past Surgical History:  Procedure Laterality Date  . CESAREAN SECTION    . THYROIDECTOMY  2007   goiter  . TOTAL VAGINAL HYSTERECTOMY  2000   ovarian cancer    Prior to Admission medications   Medication Sig Start Date End Date Taking? Authorizing Provider  albuterol (PROVENTIL HFA;VENTOLIN HFA) 108 (90 Base) MCG/ACT inhaler Inhale 2 puffs into the lungs every 6 (six) hours as needed for wheezing or shortness of breath. 03/17/16   Johnn Hai, PA-C  buPROPion (WELLBUTRIN SR) 150 MG 12 hr tablet Take 1 tablet (150 mg total) by mouth 2 (two) times daily. 04/12/16   Glean Hess, MD  cyclobenzaprine (FLEXERIL) 5 MG tablet Take 1 tablet (5 mg total) by mouth 3 (three) times daily as needed for muscle spasms. 06/30/16   Jamichael Knotts V Bacon Mccormick [place], PA-C  ketorolac (TORADOL) 10 MG  tablet Take 1 tablet (10 mg total) by mouth every 8 (eight) hours. 06/30/16   Nalu Troublefield V Bacon Renalda Locklin, PA-C  levothyroxine (SYNTHROID, LEVOTHROID) 112 MCG tablet Take 1 tablet (112 mcg total) by mouth daily. 04/12/16   Glean Hess, MD  ondansetron (ZOFRAN ODT) 4 MG disintegrating tablet Take 1 tablet (4 mg total) by mouth every 6 (six) hours as needed for nausea or vomiting. 06/30/16   Dannielle Karvonen Lynnann Knudsen, PA-C   Allergies Flagyl [metronidazole]  Family History  Problem Relation Age of Onset  . Adopted: Yes    Social History Social History  Substance Use Topics  . Smoking status: Current Some Day Smoker    Packs/day: 1.00    Years: 30.00  . Smokeless tobacco: Never Used  . Alcohol use No    Review of Systems  Constitutional: Negative for fever. Cardiovascular: Negative for chest pain. Respiratory: Negative for shortness of breath. Gastrointestinal: Negative for abdominal pain, vomiting and diarrhea. Genitourinary: Negative for dysuria. Musculoskeletal: Positive for back pain. Neurological: Negative for headaches, focal weakness or numbness. ____________________________________________  PHYSICAL EXAM:  VITAL SIGNS: ED Triage Vitals  Enc Vitals Group     BP 06/30/16 1518 90/64     Pulse Rate 06/30/16 1518 86     Resp 06/30/16 1518 18     Temp 06/30/16 1518 98.2 F (36.8 C)     Temp Source 06/30/16 1518 Oral     SpO2 06/30/16 1518 100 %     Weight 06/30/16 1519  125 lb (56.7 kg)     Height 06/30/16 1519 5' (1.524 m)     Head Circumference --      Peak Flow --      Pain Score 06/30/16 1520 10     Pain Loc --      Pain Edu? --      Excl. in [place]? --    Constitutional: Alert and oriented. Well appearing and in no distress. Head: Normocephalic and atraumatic. Cardiovascular: Normal rate, regular rhythm. Normal distal pulses. Respiratory: Normal respiratory effort. No wheezes/rales/rhonchi. Musculoskeletal: Patient with normal spinal alignment without midline  tenderness, spasm, deformity, or step-off. She has tenderness to palpation over the left SI joint region. She is to demonstrate a normal sit stand transition. She is noted to have a negative seated straight leg raise. Normal hip flexion and extension and rotational range of motion. Nontender with normal range of motion in all extremities.  Neurologic:  Mildly antalgic gait without ataxia. Normal LE DTRs bilaterally. Normal toe dorsiflexion and foot eversion on exam. Normal toe raise and heel raise them treated. Normal speech and language. No gross focal neurologic deficits are appreciated. ____________________________________________  PROCEDURES  Toradol 30 mg IM ____________________________________________  INITIAL IMPRESSION / ASSESSMENT AND PLAN / ED COURSE  Patient with an acute lumbar sacral strain with left LE sciatica on presentation. No indication for radiology imaging at this time the patient denies any recent injury, accident or trauma. She is treated in the ED with IM injection of Toradol. She is discharged with prescriptions for Toradol and Flexeril, and Zofran to dose as directed. She will be advised to follow-up with her primary care provider for ongoing management.Work note is provided for 2 days as requested. ____________________________________________  FINAL CLINICAL IMPRESSION(S) / ED DIAGNOSES  Final diagnoses:  Acute left-sided low back pain with left-sided sciatica  Sciatica of left side      Melvenia Needles, PA-C 06/30/16 1841    Nena Polio, MD 06/30/16 2125

## 2016-08-09 ENCOUNTER — Ambulatory Visit: Payer: Self-pay | Admitting: Internal Medicine

## 2016-08-10 ENCOUNTER — Ambulatory Visit: Payer: Self-pay | Admitting: Internal Medicine

## 2016-08-16 ENCOUNTER — Ambulatory Visit: Payer: Self-pay | Admitting: Internal Medicine

## 2016-09-01 ENCOUNTER — Emergency Department: Payer: 59

## 2016-09-01 ENCOUNTER — Encounter: Payer: Self-pay | Admitting: Emergency Medicine

## 2016-09-01 ENCOUNTER — Emergency Department
Admission: EM | Admit: 2016-09-01 | Discharge: 2016-09-01 | Disposition: A | Discharge status: Home / Self Care | Payer: 59 | Attending: Emergency Medicine | Admitting: Emergency Medicine

## 2016-09-01 DIAGNOSIS — Z8543 Personal history of malignant neoplasm of ovary: Secondary | ICD-10-CM | POA: Insufficient documentation

## 2016-09-01 DIAGNOSIS — Z79899 Other long term (current) drug therapy: Secondary | ICD-10-CM | POA: Insufficient documentation

## 2016-09-01 DIAGNOSIS — F17200 Nicotine dependence, unspecified, uncomplicated: Secondary | ICD-10-CM | POA: Insufficient documentation

## 2016-09-01 DIAGNOSIS — E039 Hypothyroidism, unspecified: Secondary | ICD-10-CM | POA: Insufficient documentation

## 2016-09-01 DIAGNOSIS — J9801 Acute bronchospasm: Secondary | ICD-10-CM | POA: Insufficient documentation

## 2016-09-01 MED ORDER — METHYLPREDNISOLONE 4 MG PO TBPK: ORAL_TABLET | ORAL | 0 refills | Status: DC

## 2016-09-01 MED ORDER — HYDROCOD POLST-CPM POLST ER 10-8 MG/5ML PO SUER
5.0000 mL | Freq: Once | ORAL | Status: AC
  Administered 2016-09-01: 5 mL via ORAL
  Filled 2016-09-01: qty 5

## 2016-09-01 MED ORDER — HYDROCOD POLST-CPM POLST ER 10-8 MG/5ML PO SUER: 5.0000 mL | Freq: Two times a day (BID) | ORAL | 0 refills | Status: DC

## 2016-09-01 MED ORDER — KETOROLAC TROMETHAMINE 60 MG/2ML IM SOLN
60.0000 mg | Freq: Once | INTRAMUSCULAR | Status: AC
  Administered 2016-09-01: 60 mg via INTRAMUSCULAR
  Filled 2016-09-01: qty 2

## 2016-09-01 MED ORDER — BENZONATATE 100 MG PO CAPS: 100.0000 mg | ORAL_CAPSULE | Freq: Three times a day (TID) | ORAL | 0 refills | Status: DC | PRN

## 2016-09-01 MED ORDER — BENZONATATE 100 MG PO CAPS
200.0000 mg | ORAL_CAPSULE | Freq: Once | ORAL | Status: AC
  Administered 2016-09-01: 200 mg via ORAL
  Filled 2016-09-01: qty 2

## 2016-09-01 NOTE — ED Notes (Signed)
Pt presents with cough x 7 days. She states she has had bronchitis a couple of times each year since birth. Cough is dry. She also reports pain in abdomen from coughing so much and that she is exhausted since this started. Pt alert & oriented with NAD noted.

## 2016-09-01 NOTE — ED Provider Notes (Signed)
[facility] Emergency Department Provider Note   ____________________________________________   First MD Initiated Contact with Patient 09/01/16 1255     (approximate)  I have reviewed the triage vital signs and the nursing notes.   HISTORY  Chief Complaint Bronchitis    HPI Rachael Miller is a 46 y.o. female patient complaining of a nonproductive cough for 1 week. Patient has  history of bronchitis. Patient said her body is sore and she is exhausted from the continued cough. Patient is active coughing as I get her history.He denies any fever associated with this complaint. She rates the pain as a 9/10 which is only present when she coughs. Patient state refractory to over-the-counter medications. Patient denies dyspnea with this complaint.  Past Medical History:  Diagnosis Date  . Chronic bronchitis ([place])   . History of thyroidectomy    right voice cord was removed during surgery  . Ovarian cancer ([place])    ovarion  . Pneumonia     Patient Active Problem List   Diagnosis Date Noted  . Hx of ovarian cancer 04/12/2016  . Tobacco use disorder 04/12/2016  . 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 Asthma in adult, mild intermittent, uncomplicated 09/38/1829    Past Surgical History:  Procedure Laterality Date  . CESAREAN SECTION    . THYROIDECTOMY  2007   goiter  . TOTAL VAGINAL HYSTERECTOMY  2000   ovarian cancer    Prior to Admission medications   Medication Sig Start Date End Date Taking? Authorizing Provider  albuterol (PROVENTIL HFA;VENTOLIN HFA) 108 (90 Base) MCG/ACT inhaler Inhale 2 puffs into the lungs every 6 (six) hours as needed for wheezing or shortness of breath. 03/17/16   Johnn Hai, PA-C  benzonatate (TESSALON PERLES) 100 MG capsule Take 1 capsule (100 mg total) by mouth 3 (three) times daily as needed for cough. 09/01/16 09/01/17  Sable Feil, PA-C  buPROPion (WELLBUTRIN SR) 150 MG 12 hr tablet Take 1 tablet  (150 mg total) by mouth 2 (two) times daily. 04/12/16   Glean Hess, MD  chlorpheniramine-HYDROcodone [facility] PENNKINETIC ER) 10-8 MG/5ML SUER Take 5 mLs by mouth 2 (two) times daily. 09/01/16   Sable Feil, PA-C  cyclobenzaprine (FLEXERIL) 5 MG tablet Take 1 tablet (5 mg total) by mouth 3 (three) times daily as needed for muscle spasms. 06/30/16   Jenise V Bacon Menshew, PA-C  ketorolac (TORADOL) 10 MG tablet Take 1 tablet (10 mg total) by mouth every 8 (eight) hours. 06/30/16   Jenise V Bacon Menshew, PA-C  levothyroxine (SYNTHROID, LEVOTHROID) 112 MCG tablet Take 1 tablet (112 mcg total) by mouth daily. 04/12/16   Glean Hess, MD  methylPREDNISolone (MEDROL DOSEPAK) 4 MG TBPK tablet Take Tapered dose as directed 09/01/16   Sable Feil, PA-C  ondansetron (ZOFRAN ODT) 4 MG disintegrating tablet Take 1 tablet (4 mg total) by mouth every 6 (six) hours as needed for nausea or vomiting. 06/30/16   Dannielle Karvonen Menshew, PA-C    Allergies Flagyl [metronidazole]  Family History  Problem Relation Age of Onset  . Adopted: Yes    Social History Social History  Substance Use Topics  . Smoking status: Current Some Day Smoker    Packs/day: 1.00    Years: 30.00  . Smokeless tobacco: Never Used  . Alcohol use No    Review of Systems Constitutional: No fever/chills Eyes: No visual changes. ENT: No sore throat. Cardiovascular: Denies chest pain. Respiratory: Denies shortness of breath. Nonproductive  cough Gastrointestinal: No abdominal pain.  No nausea, no vomiting.  No diarrhea.  No constipation. Genitourinary: Negative for dysuria. Musculoskeletal:Chest wall pain secondary to coughing Skin: Negative for rash. Neurological: Negative for headaches, focal weakness or numbness. Allergic/Immunilogical: Flagyl .  ____________________________________________   PHYSICAL EXAM:  VITAL SIGNS: ED Triage Vitals  Enc Vitals Group     BP 09/01/16 1225 (!) 124/94     Pulse Rate  09/01/16 1225 99     Resp 09/01/16 1225 (!) 22     Temp 09/01/16 1225 98.6 F (37 C)     Temp Source 09/01/16 1225 Oral     SpO2 09/01/16 1225 97 %     Weight 09/01/16 1226 125 lb (56.7 kg)     Height --      Head Circumference --      Peak Flow --      Pain Score 09/01/16 1225 9     Pain Loc --      Pain Edu? --      Excl. in [place]? --     Constitutional: Alert and oriented. Well appearing and in no acute distress. Eyes: Conjunctivae are normal. PERRL. EOMI. Head: Atraumatic. Nose: No congestion/rhinnorhea. Mouth/Throat: Mucous membranes are moist.  Oropharynx non-erythematous. Neck: No stridor.  No cervical spine tenderness to palpation. Hematological/Lymphatic/Immunilogical: No cervical lymphadenopathy. Cardiovascular: Normal rate, regular rhythm. Grossly normal heart sounds.  Good peripheral circulation. Respiratory: Normal respiratory effort.  No retractions. Lungs with rhonchi breath sounds. Nonproductive cough. Gastrointestinal: Soft and nontender. No distention. No abdominal bruits. No CVA tenderness. Musculoskeletal: No lower extremity tenderness nor edema.  No joint effusions. Neurologic:  Normal speech and language. No gross focal neurologic deficits are appreciated. No gait instability. Skin:  Skin is warm, dry and intact. No rash noted. Psychiatric: Mood and affect are normal. Speech and behavior are normal.  ____________________________________________   LABS (all labs ordered are listed, but only abnormal results are displayed)  Labs Reviewed - No data to display ____________________________________________  EKG   ____________________________________________  RADIOLOGY  No acute findings on chest x-ray ____________________________________________   PROCEDURES  Procedure(s) performed: None  Procedures  Critical Care performed: No  ____________________________________________   INITIAL IMPRESSION / ASSESSMENT AND PLAN / ED COURSE  Pertinent  labs & imaging results that were available during my care of the patient were reviewed by me and considered in my medical decision making (see chart for details).  All secondary to bronchospasm. Patient given discharge care instructions. Patient given prescriptions for menstrual dosepak, Tessalon Perles, and Tussionex to use at night. Patient advised to follow-up family doctor this condition persists.      ____________________________________________   FINAL CLINICAL IMPRESSION(S) / ED DIAGNOSES  Final diagnoses:  Cough due to bronchospasm      NEW MEDICATIONS STARTED DURING THIS VISIT:  New Prescriptions   BENZONATATE (TESSALON PERLES) 100 MG CAPSULE    Take 1 capsule (100 mg total) by mouth 3 (three) times daily as needed for cough.   CHLORPHENIRAMINE-HYDROCODONE (TUSSIONEX PENNKINETIC ER) 10-8 MG/5ML SUER    Take 5 mLs by mouth 2 (two) times daily.   METHYLPREDNISOLONE (MEDROL DOSEPAK) 4 MG TBPK TABLET    Take Tapered dose as directed     Note:  This document was prepared using Dragon voice recognition software and may include unintentional dictation errors.    Sable Feil, PA-C 09/01/16 [place], MD 09/03/16 [phone]

## 2016-09-01 NOTE — ED Triage Notes (Signed)
Pt with cough for over a week. Pt with hx of bronchitis.

## 2016-11-09 ENCOUNTER — Ambulatory Visit (INDEPENDENT_AMBULATORY_CARE_PROVIDER_SITE_OTHER): Payer: 59 | Admitting: Internal Medicine

## 2016-11-09 ENCOUNTER — Encounter: Payer: Self-pay | Admitting: Internal Medicine

## 2016-11-09 VITALS — BP 98/64 | HR 89 | Ht 60.0 in | Wt 131.0 lb

## 2016-11-09 DIAGNOSIS — M533 Sacrococcygeal disorders, not elsewhere classified: Secondary | ICD-10-CM | POA: Diagnosis not present

## 2016-11-09 DIAGNOSIS — E890 Postprocedural hypothyroidism: Secondary | ICD-10-CM | POA: Diagnosis not present

## 2016-11-09 MED ORDER — TRAMADOL HCL 50 MG PO TABS: 50.0000 mg | ORAL_TABLET | Freq: Three times a day (TID) | ORAL | 0 refills | Status: DC | PRN

## 2016-11-09 MED ORDER — CYCLOBENZAPRINE HCL 10 MG PO TABS: 10.0000 mg | ORAL_TABLET | Freq: Three times a day (TID) | ORAL | 0 refills | Status: DC | PRN

## 2016-11-09 NOTE — Progress Notes (Signed)
Date:  11/09/2016   Name:  Rachael Miller   DOB:  29-Sep-1970   MRN:  [id number]   Chief Complaint: Hypothyroidism (Needs medication refill - Wants to discuss higher dose due to weight gain and tired. ) and Back Pain (X 3 weeks. Lifted something that was 210lbs and lifted without using legs. Pain is located in left lower back - closer to spine. Advil, tylenol, and aleve does not help. )  Back Pain  This is a new problem. The current episode started 1 to 4 weeks ago. The problem occurs constantly. The problem is unchanged. The pain is present in the lumbar spine. The quality of the pain is described as aching. The pain radiates to the left thigh. The pain is moderate. The pain is the same all the time. The symptoms are aggravated by sitting. Associated symptoms include leg pain and numbness. Pertinent negatives include no bladder incontinence, bowel incontinence, chest pain, fever, perianal numbness or weakness. She has tried heat, ice and NSAIDs for the symptoms. The treatment provided no relief.  Thyroid Problem  Presents for follow-up visit. Symptoms include fatigue. Patient reports no constipation, diarrhea or palpitations. The symptoms have been stable.     Review of Systems  Constitutional: Positive for fatigue. Negative for chills and fever.  Eyes: Negative for visual disturbance.  Respiratory: Negative for chest tightness and shortness of breath.   Cardiovascular: Negative for chest pain and palpitations.  Gastrointestinal: Negative for bowel incontinence, constipation and diarrhea.  Genitourinary: Negative for bladder incontinence.  Musculoskeletal: Positive for back pain.  Neurological: Positive for numbness. Negative for weakness.  Psychiatric/Behavioral: Positive for sleep disturbance.    Patient Active Problem List   Diagnosis Date Noted  . Hx of ovarian cancer 04/12/2016  . Tobacco use disorder 04/12/2016  . 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 Asthma in  adult, mild intermittent, uncomplicated 01/65/5374    Prior to Admission medications   Medication Sig Start Date End Date Taking? Authorizing Provider  albuterol (PROVENTIL HFA;VENTOLIN HFA) 108 (90 Base) MCG/ACT inhaler Inhale 2 puffs into the lungs every 6 (six) hours as needed for wheezing or shortness of breath. 03/17/16  Yes Johnn Hai, PA-C  levothyroxine (SYNTHROID, LEVOTHROID) 112 MCG tablet Take 1 tablet (112 mcg total) by mouth daily. 04/12/16  Yes Glean Hess, MD    Allergies  Allergen Reactions  . Flagyl [Metronidazole] Hives    Past Surgical History:  Procedure Laterality Date  . CESAREAN SECTION    . THYROIDECTOMY  2007   goiter  . TOTAL VAGINAL HYSTERECTOMY  2000   ovarian cancer    Social History  Substance Use Topics  . Smoking status: Current Some Day Smoker    Packs/day: 1.00    Years: 30.00  . Smokeless tobacco: Never Used  . Alcohol use No     Medication list has been reviewed and updated.   Physical Exam  Constitutional: She is oriented to person, place, and time. She appears well-developed. No distress.  HENT:  Head: Normocephalic and atraumatic.  Neck: Normal range of motion. Neck supple. No thyromegaly present.  Cardiovascular: Normal rate, regular rhythm and normal heart sounds.   Pulmonary/Chest: Effort normal and breath sounds normal. No respiratory distress. She has no wheezes.  Musculoskeletal: Normal range of motion.       Lumbar back: She exhibits tenderness and spasm.  SLR + on left  Neurological: She is alert and oriented to person, place, and time. She has normal strength and normal  reflexes. No sensory deficit. Gait normal.  Skin: Skin is warm and dry. No rash noted.  Psychiatric: She has a normal mood and affect. Her behavior is normal. Thought content normal.  Nursing note and vitals reviewed.   BP 98/64 (BP Location: Right Arm, Patient Position: Sitting, Cuff Size: Normal)   Pulse 89   Ht 5' (1.524 m)   Wt 131  lb (59.4 kg)   SpO2 98%   BMI 25.58 kg/m   Assessment and Plan: 1. Postoperative hypothyroidism Check labs and advise on dose adjustment - TSH  2. Sacro-iliac pain Continue advil 800 mg tid Continue ice or heat - traMADol (ULTRAM) 50 MG tablet; Take 1 tablet (50 mg total) by mouth every 8 (eight) hours as needed.  Dispense: 30 tablet; Refill: 0 - cyclobenzaprine (FLEXERIL) 10 MG tablet; Take 1 tablet (10 mg total) by mouth 3 (three) times daily as needed for muscle spasms.  Dispense: 30 tablet; Refill: 0   Meds ordered this encounter  Medications  . traMADol (ULTRAM) 50 MG tablet    Sig: Take 1 tablet (50 mg total) by mouth every 8 (eight) hours as needed.    Dispense:  30 tablet    Refill:  0  . cyclobenzaprine (FLEXERIL) 10 MG tablet    Sig: Take 1 tablet (10 mg total) by mouth 3 (three) times daily as needed for muscle spasms.    Dispense:  30 tablet    Refill:  0    Halina Maidens, MD [facility]  11/09/2016

## 2016-11-09 NOTE — Patient Instructions (Signed)
Continue Ice - 20 minutes three times a day  Continue Ibuprofen 800 mg three times a day

## 2016-11-10 ENCOUNTER — Other Ambulatory Visit: Payer: Self-pay | Admitting: Internal Medicine

## 2016-11-10 DIAGNOSIS — E890 Postprocedural hypothyroidism: Secondary | ICD-10-CM

## 2016-11-10 LAB — TSH: TSH: 1.12 u[IU]/mL (ref 0.450–4.500)

## 2016-11-10 MED ORDER — LEVOTHYROXINE SODIUM 112 MCG PO TABS: 112.0000 ug | ORAL_TABLET | Freq: Every day | ORAL | 3 refills | Status: DC

## 2017-01-14 ENCOUNTER — Emergency Department
Admission: EM | Admit: 2017-01-14 | Discharge: 2017-01-14 | Disposition: A | Discharge status: Home / Self Care | Payer: Self-pay | Attending: Emergency Medicine | Admitting: Emergency Medicine

## 2017-01-14 ENCOUNTER — Emergency Department: Payer: Self-pay

## 2017-01-14 ENCOUNTER — Encounter: Payer: Self-pay | Admitting: Emergency Medicine

## 2017-01-14 DIAGNOSIS — Z79899 Other long term (current) drug therapy: Secondary | ICD-10-CM | POA: Insufficient documentation

## 2017-01-14 DIAGNOSIS — F17210 Nicotine dependence, cigarettes, uncomplicated: Secondary | ICD-10-CM | POA: Insufficient documentation

## 2017-01-14 DIAGNOSIS — J4520 Mild intermittent asthma, uncomplicated: Secondary | ICD-10-CM | POA: Insufficient documentation

## 2017-01-14 DIAGNOSIS — J069 Acute upper respiratory infection, unspecified: Secondary | ICD-10-CM | POA: Insufficient documentation

## 2017-01-14 DIAGNOSIS — E890 Postprocedural hypothyroidism: Secondary | ICD-10-CM | POA: Insufficient documentation

## 2017-01-14 DIAGNOSIS — Z8709 Personal history of other diseases of the respiratory system: Secondary | ICD-10-CM | POA: Insufficient documentation

## 2017-01-14 MED ORDER — DOXYCYCLINE HYCLATE 50 MG PO CAPS: 100.0000 mg | ORAL_CAPSULE | Freq: Two times a day (BID) | ORAL | 0 refills | Status: AC

## 2017-01-14 MED ORDER — IPRATROPIUM-ALBUTEROL 0.5-2.5 (3) MG/3ML IN SOLN
3.0000 mL | Freq: Once | RESPIRATORY_TRACT | Status: AC
  Administered 2017-01-14: 3 mL via RESPIRATORY_TRACT
  Filled 2017-01-14: qty 3

## 2017-01-14 MED ORDER — ALBUTEROL SULFATE HFA 108 (90 BASE) MCG/ACT IN AERS: 2.0000 | INHALATION_SPRAY | Freq: Four times a day (QID) | RESPIRATORY_TRACT | 0 refills | Status: DC | PRN

## 2017-01-14 MED ORDER — PREDNISONE 10 MG PO TABS: ORAL_TABLET | ORAL | 0 refills | Status: DC

## 2017-01-14 MED ORDER — ALBUTEROL SULFATE (2.5 MG/3ML) 0.083% IN NEBU: 2.5000 mg | INHALATION_SOLUTION | Freq: Four times a day (QID) | RESPIRATORY_TRACT | 12 refills | Status: DC | PRN

## 2017-01-14 NOTE — ED Triage Notes (Signed)
Pt c/o nasal congestion with posterior drainage for 1 week. Throat hurting from drainage. Has had a cough from drainage and feels like congested in chest as well.  Has pain to frontal and maxillary sinus.

## 2017-01-14 NOTE — ED Provider Notes (Signed)
[facility] Emergency Department Provider Note  ____________________________________________  Time seen: Approximately 3:30 PM  I have reviewed the triage vital signs and the nursing notes.   HISTORY  Chief Complaint Nasal Congestion    HPI Rachael Miller is a 46 y.o. female that presents to the emergency department with one week of sinus pressure, nasal congestion, sore throat, nonproductive cough. She states that she gets bronchitis twice a year. She smokes a pack of cigarettes per day but has not been able to smoke for the last 2 days. She has had pneumonia 4 times. She has a nebulizer machine home but does not have medication for it. No fever, shortness of breath, chest pain, vomiting, abdominal pain, diarrhea, constipation.   Past Medical History:  Diagnosis Date  . Chronic bronchitis ([place])   . History of thyroidectomy    right voice cord was removed during surgery  . Ovarian cancer ([place])    ovarion  . Pneumonia     Patient Active Problem List   Diagnosis Date Noted  . Hx of ovarian cancer 04/12/2016  . Tobacco use disorder 04/12/2016  . 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 Asthma in adult, mild intermittent, uncomplicated 11/30/9483    Past Surgical History:  Procedure Laterality Date  . CESAREAN SECTION    . THYROIDECTOMY  2007   goiter  . TOTAL VAGINAL HYSTERECTOMY  2000   ovarian cancer    Prior to Admission medications   Medication Sig Start Date End Date Taking? Authorizing Provider  albuterol (PROVENTIL HFA;VENTOLIN HFA) 108 (90 Base) MCG/ACT inhaler Inhale 2 puffs into the lungs every 6 (six) hours as needed for wheezing or shortness of breath. 01/14/17   Laban Emperor, PA-C  albuterol (PROVENTIL) (2.5 MG/3ML) 0.083% nebulizer solution Take 3 mLs (2.5 mg total) by nebulization every 6 (six) hours as needed for wheezing or shortness of breath. 01/14/17   Laban Emperor, PA-C  cyclobenzaprine (FLEXERIL) 10 MG tablet Take  1 tablet (10 mg total) by mouth 3 (three) times daily as needed for muscle spasms. 11/09/16   Glean Hess, MD  doxycycline (VIBRAMYCIN) 50 MG capsule Take 2 capsules (100 mg total) by mouth 2 (two) times daily. 01/14/17 01/24/17  Laban Emperor, PA-C  levothyroxine (SYNTHROID, LEVOTHROID) 112 MCG tablet Take 1 tablet (112 mcg total) by mouth daily. 11/10/16   Glean Hess, MD  predniSONE (DELTASONE) 10 MG tablet Take 6 tablets on day 1, take 5 tablets on day 2, take 4 tablets on day 3, take 3 tablets on day 4, take 2 tablets on day 5, take 1 tablet on day 6 01/14/17   Laban Emperor, PA-C  traMADol (ULTRAM) 50 MG tablet Take 1 tablet (50 mg total) by mouth every 8 (eight) hours as needed. 11/09/16   Glean Hess, MD    Allergies Flagyl [metronidazole]  Family History  Problem Relation Age of Onset  . Adopted: Yes    Social History Social History  Substance Use Topics  . Smoking status: Current Some Day Smoker    Packs/day: 1.00    Years: 30.00  . Smokeless tobacco: Never Used  . Alcohol use No     Review of Systems  Constitutional: No fever/chills Cardiovascular: No chest pain. Respiratory: Positive for cough. No SOB. Gastrointestinal: No abdominal pain.  No nausea, no vomiting.  Musculoskeletal: Negative for musculoskeletal pain. Skin: Negative for rash, abrasions, lacerations, ecchymosis. Neurological: Negative for headaches   ____________________________________________   PHYSICAL EXAM:  VITAL SIGNS: ED Triage Vitals [  01/14/17 1350]  Enc Vitals Group     BP 122/90     Pulse Rate (!) 103     Resp 18     Temp 98.2 F (36.8 C)     Temp Source Oral     SpO2 98 %     Weight 125 lb (56.7 kg)     Height 5' (1.524 m)     Head Circumference      Peak Flow      Pain Score 7     Pain Loc      Pain Edu?      Excl. in [place]?      Constitutional: Alert and oriented. Well appearing and in no acute distress. Eyes: Conjunctivae are normal. PERRL. EOMI. Head:  Atraumatic. ENT: Tenderness to palpation over maxillary sinus      Ears: Tympanic membranes pearly gray. No drainage.      Nose: Positive for congestion/rhinnorhea.      Mouth/Throat: Mucous membranes are moist.  Neck: No stridor. Cardiovascular: Normal rate, regular rhythm.  Good peripheral circulation. Respiratory: Normal respiratory effort without tachypnea or retractions. Lungs CTAB. Good air entry to the bases with no decreased or absent breath sounds. Gastrointestinal: Bowel sounds 4 quadrants. Soft and nontender to palpation. No guarding or rigidity. No palpable masses. No distention.  Musculoskeletal: Full range of motion to all extremities. No gross deformities appreciated. Neurologic:  Normal speech and language. No gross focal neurologic deficits are appreciated.  Skin:  Skin is warm, dry and intact. No rash noted.   ____________________________________________   LABS (all labs ordered are listed, but only abnormal results are displayed)  Labs Reviewed - No data to display ____________________________________________  EKG   ____________________________________________  RADIOLOGY Robinette Haines, personally viewed and evaluated these images (plain radiographs) as part of my medical decision making, as well as reviewing the written report by the radiologist.  Dg Chest 2 View  Result Date: 01/14/2017 CLINICAL DATA:  Cough and congestion, history smoking, chronic bronchitis, and prior pneumonia EXAM: CHEST  2 VIEW COMPARISON:  09/01/2016 FINDINGS: Normal heart size, mediastinal contours, and pulmonary vascularity. Lungs clear. No pleural effusion or pneumothorax. Bones unremarkable. IMPRESSION: Normal exam. Electronically Signed   By: Lavonia Dana M.D.   On: 01/14/2017 14:49    ____________________________________________    PROCEDURES  Procedure(s) performed:    Procedures    Medications  ipratropium-albuterol (DUONEB) 0.5-2.5 (3) MG/3ML nebulizer solution 3  mL (3 mLs Nebulization Given 01/14/17 1615)     ____________________________________________   INITIAL IMPRESSION / ASSESSMENT AND PLAN / ED COURSE  Pertinent labs & imaging results that were available during my care of the patient were reviewed by me and considered in my medical decision making (see chart for details).  Review of the [place] CSRS was performed in accordance of the [place] prior to dispensing any controlled drugs.   Patient's diagnosis is consistent with upper respiratory infection. Vital signs and exam are reassuring. X-ray negative for acute cardiopulmonary processes. Patient received DuoNeb in ED and felt better. Patient will be discharged home with prescriptions for doxycycline, prednisone, nebulizer solution. Patient is to follow up with PCP as directed. Patient is given ED precautions to return to the ED for any worsening or new symptoms.     ____________________________________________  FINAL CLINICAL IMPRESSION(S) / ED DIAGNOSES  Final diagnoses:  Upper respiratory tract infection, unspecified type      NEW MEDICATIONS STARTED DURING THIS VISIT:  Discharge Medication List as of 01/14/2017  3:49  PM    START taking these medications   Details  albuterol (PROVENTIL) (2.5 MG/3ML) 0.083% nebulizer solution Take 3 mLs (2.5 mg total) by nebulization every 6 (six) hours as needed for wheezing or shortness of breath., Starting Sat 01/14/2017, Print    doxycycline (VIBRAMYCIN) 50 MG capsule Take 2 capsules (100 mg total) by mouth 2 (two) times daily., Starting Sat 01/14/2017, Until Tue 01/24/2017, Print    predniSONE (DELTASONE) 10 MG tablet Take 6 tablets on day 1, take 5 tablets on day 2, take 4 tablets on day 3, take 3 tablets on day 4, take 2 tablets on day 5, take 1 tablet on day 6, Print            This chart was dictated using voice recognition software/Dragon. Despite best efforts to proofread, errors can occur which can change the meaning. Any change was  purely unintentional.    Laban Emperor, PA-C 01/14/17 1718    Harvest Dark, MD 01/15/17 [phone]

## 2017-05-03 ENCOUNTER — Encounter: Payer: Self-pay | Admitting: Internal Medicine

## 2017-05-03 ENCOUNTER — Ambulatory Visit (INDEPENDENT_AMBULATORY_CARE_PROVIDER_SITE_OTHER): Payer: Self-pay | Admitting: Internal Medicine

## 2017-05-03 VITALS — BP 112/70 | HR 101 | Temp 98.1°F | Ht 60.0 in | Wt 134.0 lb

## 2017-05-03 DIAGNOSIS — M5432 Sciatica, left side: Secondary | ICD-10-CM

## 2017-05-03 DIAGNOSIS — J0100 Acute maxillary sinusitis, unspecified: Secondary | ICD-10-CM

## 2017-05-03 MED ORDER — AMOXICILLIN 500 MG PO CAPS: 500.0000 mg | ORAL_CAPSULE | Freq: Three times a day (TID) | ORAL | 0 refills | Status: DC

## 2017-05-03 MED ORDER — PREDNISONE 10 MG PO TABS: ORAL_TABLET | ORAL | 0 refills | Status: DC

## 2017-05-03 NOTE — Patient Instructions (Signed)
Dulera - one puff twice a day

## 2017-05-03 NOTE — Progress Notes (Signed)
Date:  05/03/2017   Name:  Rachael Miller   DOB:  May 21, 1971   MRN:  [id number]   Chief Complaint: URI (Started Friday- Coughing with some mucous. White milky mucous. Throat is irritated. Left Ear is painful- feels like have ear infection. ) and Nerve pain (Started 2 weeks ago. Pain radiates from buttcheek down to back of thigh. )  Otalgia   There is pain in the left ear. This is a new problem. The current episode started in the past 7 days. The problem occurs constantly. There has been no fever. Associated symptoms include coughing. Pertinent negatives include no headaches. She has tried NSAIDs for the symptoms. The treatment provided no relief.  Cough  This is a new problem. The current episode started in the past 7 days. The problem has been gradually worsening. The problem occurs every few minutes. The cough is non-productive. Associated symptoms include ear pain, shortness of breath and wheezing. Pertinent negatives include no chest pain, chills, fever or headaches. Risk factors for lung disease include smoking/tobacco exposure. She has tried nothing for the symptoms.  Back Pain  This is a new problem. The current episode started in the past 7 days. The problem occurs constantly. The problem is unchanged. The pain is present in the gluteal. The quality of the pain is described as aching. The pain radiates to the left knee. The pain is at a severity of 4/10. Pertinent negatives include no chest pain, fever, headaches, numbness or weakness. She has tried ice, NSAIDs, heat and bed rest for the symptoms. The treatment provided no relief.    Review of Systems  Constitutional: Negative for chills, fatigue and fever.  HENT: Positive for ear pain, sinus pressure and sinus pain. Negative for trouble swallowing.   Respiratory: Positive for cough, chest tightness, shortness of breath and wheezing.   Cardiovascular: Negative for chest pain and palpitations.  Musculoskeletal: Positive for  back pain.  Neurological: Negative for dizziness, weakness, numbness and headaches.    Patient Active Problem List   Diagnosis Date Noted  . Hx of ovarian cancer 04/12/2016  . Tobacco use disorder 04/12/2016  . 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 Asthma in adult, mild intermittent, uncomplicated 48/18/5631    Prior to Admission medications   Medication Sig Start Date End Date Taking? Authorizing Provider  levothyroxine (SYNTHROID, LEVOTHROID) 112 MCG tablet Take 1 tablet (112 mcg total) by mouth daily. 11/10/16  Yes Glean Hess, MD  albuterol (PROVENTIL HFA;VENTOLIN HFA) 108 (90 Base) MCG/ACT inhaler Inhale 2 puffs into the lungs every 6 (six) hours as needed for wheezing or shortness of breath. Patient not taking: Reported on 05/03/2017 01/14/17   Laban Emperor, PA-C  albuterol (PROVENTIL) (2.5 MG/3ML) 0.083% nebulizer solution Take 3 mLs (2.5 mg total) by nebulization every 6 (six) hours as needed for wheezing or shortness of breath. Patient not taking: Reported on 05/03/2017 01/14/17   Laban Emperor, PA-C  amoxicillin (AMOXIL) 500 MG capsule Take 1 capsule (500 mg total) by mouth 3 (three) times daily. 05/03/17   Glean Hess, MD  predniSONE (DELTASONE) 10 MG tablet Take 6 on day 1and 2, 5 on day 3 and 4, 4 on day 5 and 6 , 3 on day 7 and 8, 2 on day 9 and 10 and 1 on day 11 and 12 then stop. 05/03/17   Glean Hess, MD    Allergies  Allergen Reactions  . Flagyl [Metronidazole] Hives    Past Surgical History:  Procedure Laterality  Date  . CESAREAN SECTION    . THYROIDECTOMY  2007   goiter  . TOTAL VAGINAL HYSTERECTOMY  2000   ovarian cancer    Social History   Tobacco Use  . Smoking status: Current Some Day Smoker    Packs/day: 1.00    Years: 30.00    Pack years: 30.00  . Smokeless tobacco: Never Used  Substance Use Topics  . Alcohol use: No  . Drug use: No     Medication list has been reviewed and updated.  PHQ 2/9 Scores 05/03/2017  PHQ  - 2 Score 0    Physical Exam  Constitutional: She is oriented to person, place, and time. She appears well-developed. No distress.  HENT:  Head: Normocephalic and atraumatic.  Right Ear: Tympanic membrane is retracted. Tympanic membrane is not erythematous.  Left Ear: Tympanic membrane is retracted. Tympanic membrane is not erythematous.  Nose: Right sinus exhibits maxillary sinus tenderness. Left sinus exhibits maxillary sinus tenderness.  Mouth/Throat: No posterior oropharyngeal edema or posterior oropharyngeal erythema.  Cardiovascular: Normal rate, regular rhythm and S1 normal.  Pulmonary/Chest: Effort normal. No respiratory distress. She has wheezes.  Musculoskeletal: Normal range of motion.       Arms: Neurological: She is alert and oriented to person, place, and time. She has normal strength and normal reflexes. Coordination and gait normal.  SLR positive on left  Skin: Skin is warm and dry. No rash noted.  Psychiatric: She has a normal mood and affect. Her speech is normal and behavior is normal. Thought content normal.  Nursing note and vitals reviewed.   BP 112/70   Pulse (!) 101   Temp 98.1 F (36.7 C) (Oral)   Ht 5' (1.524 m)   Wt 134 lb (60.8 kg)   SpO2 98%   BMI 26.17 kg/m   Assessment and Plan: 1. Acute non-recurrent maxillary sinusitis - amoxicillin (AMOXIL) 500 MG capsule; Take 1 capsule (500 mg total) by mouth 3 (three) times daily.  Dispense: 30 capsule; Refill: 0 Dulera sample given  2. Sciatica of left side Continue heat or ice - predniSONE (DELTASONE) 10 MG tablet; Take 6 on day 1and 2, 5 on day 3 and 4, 4 on day 5 and 6 , 3 on day 7 and 8, 2 on day 9 and 10 and 1 on day 11 and 12 then stop.  Dispense: 42 tablet; Refill: 0   Meds ordered this encounter  Medications  . predniSONE (DELTASONE) 10 MG tablet    Sig: Take 6 on day 1and 2, 5 on day 3 and 4, 4 on day 5 and 6 , 3 on day 7 and 8, 2 on day 9 and 10 and 1 on day 11 and 12 then stop.     Dispense:  42 tablet    Refill:  0  . amoxicillin (AMOXIL) 500 MG capsule    Sig: Take 1 capsule (500 mg total) by mouth 3 (three) times daily.    Dispense:  30 capsule    Refill:  0    Partially dictated using Editor, commissioning. Any errors are unintentional.  Halina Maidens, MD [facility]  05/03/2017

## 2017-05-05 ENCOUNTER — Ambulatory Visit: Payer: 59 | Admitting: Internal Medicine

## 2017-06-05 ENCOUNTER — Other Ambulatory Visit: Payer: Self-pay

## 2017-06-05 DIAGNOSIS — E890 Postprocedural hypothyroidism: Secondary | ICD-10-CM

## 2017-06-05 MED ORDER — LEVOTHYROXINE SODIUM 112 MCG PO TABS: 112.0000 ug | ORAL_TABLET | Freq: Every day | ORAL | 0 refills | Status: DC

## 2017-06-09 ENCOUNTER — Encounter: Payer: Self-pay | Admitting: Internal Medicine

## 2017-06-09 ENCOUNTER — Ambulatory Visit (INDEPENDENT_AMBULATORY_CARE_PROVIDER_SITE_OTHER): Payer: Self-pay | Admitting: Internal Medicine

## 2017-06-09 VITALS — BP 112/78 | HR 104 | Temp 98.1°F | Ht 60.0 in | Wt 135.0 lb

## 2017-06-09 DIAGNOSIS — J0140 Acute pansinusitis, unspecified: Secondary | ICD-10-CM

## 2017-06-09 MED ORDER — LEVOFLOXACIN 500 MG PO TABS: 500.0000 mg | ORAL_TABLET | Freq: Every day | ORAL | 0 refills | Status: AC

## 2017-06-09 MED ORDER — PREDNISONE 10 MG PO TABS: ORAL_TABLET | ORAL | 0 refills | Status: DC

## 2017-06-09 NOTE — Progress Notes (Signed)
Date:  06/09/2017   Name:  Rachael Miller   DOB:  [birth year]   MRN:  [id number]   Chief Complaint: Cough (Cough w/ congestion- sore throat. Started a month ago. Was seen for all this. Has gotten worse- never gotten better. Headache- "pounding". Clear mucous coming up/ Chest thick and heavy with mucous. No energy, weak. Been out of work since last Friday.  )  Sinusitis  This is a recurrent problem. The problem has been gradually worsening since onset. There has been no fever. The pain is moderate. Associated symptoms include coughing, headaches, sinus pressure and a sore throat. Pertinent negatives include no chills, ear pain or shortness of breath. Past treatments include acetaminophen and oral decongestants. The treatment provided mild relief.     Review of Systems  Constitutional: Positive for fatigue. Negative for chills and fever.  HENT: Positive for sinus pressure, sinus pain and sore throat. Negative for ear pain, trouble swallowing and voice change.   Respiratory: Positive for cough and wheezing. Negative for chest tightness and shortness of breath.   Cardiovascular: Negative for chest pain and palpitations.  Gastrointestinal: Negative for abdominal pain, diarrhea and vomiting.  Neurological: Positive for headaches.    Patient Active Problem List   Diagnosis Date Noted  . Hx of ovarian cancer 04/12/2016  . Tobacco use disorder 04/12/2016  . 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 Asthma in adult, mild intermittent, uncomplicated 41/96/2229    Prior to Admission medications   Medication Sig Start Date End Date Taking? Authorizing Provider  albuterol (PROVENTIL HFA;VENTOLIN HFA) 108 (90 Base) MCG/ACT inhaler Inhale 2 puffs into the lungs every 6 (six) hours as needed for wheezing or shortness of breath. 01/14/17  Yes Laban Emperor, PA-C  albuterol (PROVENTIL) (2.5 MG/3ML) 0.083% nebulizer solution Take 3 mLs (2.5 mg total) by nebulization every 6 (six) hours as  needed for wheezing or shortness of breath. 01/14/17  Yes Laban Emperor, PA-C  levothyroxine (SYNTHROID, LEVOTHROID) 112 MCG tablet Take 1 tablet (112 mcg total) by mouth daily. 06/05/17  Yes Glean Hess, MD    Allergies  Allergen Reactions  . Flagyl [Metronidazole] Hives    Past Surgical History:  Procedure Laterality Date  . CESAREAN SECTION    . THYROIDECTOMY  2007   goiter  . TOTAL VAGINAL HYSTERECTOMY  2000   ovarian cancer    Social History   Tobacco Use  . Smoking status: Current Some Day Smoker    Packs/day: 1.00    Years: 30.00    Pack years: 30.00  . Smokeless tobacco: Never Used  Substance Use Topics  . Alcohol use: No  . Drug use: No     Medication list has been reviewed and updated.  PHQ 2/9 Scores 05/03/2017  PHQ - 2 Score 0    Physical Exam  Constitutional: She is oriented to person, place, and time. She appears well-developed and well-nourished.  HENT:  Right Ear: External ear and ear canal normal. Tympanic membrane is not erythematous and not retracted.  Left Ear: Tympanic membrane, external ear and ear canal normal. Tympanic membrane is not erythematous and not retracted.  Nose: Right sinus exhibits maxillary sinus tenderness and frontal sinus tenderness. Left sinus exhibits maxillary sinus tenderness and frontal sinus tenderness.  Mouth/Throat: Uvula is midline and mucous membranes are normal. No oral lesions. Posterior oropharyngeal erythema present. No oropharyngeal exudate.  Neck: Neck supple.  Cardiovascular: Normal rate, regular rhythm and normal heart sounds.  Pulmonary/Chest: She has decreased breath sounds. She has  no wheezes. She has no rhonchi. She has no rales.  Musculoskeletal: She exhibits no edema or tenderness.  Lymphadenopathy:    She has no cervical adenopathy.  Neurological: She is alert and oriented to person, place, and time.    BP 112/78   Pulse (!) 104   Temp 98.1 F (36.7 C) (Oral)   Ht 5' (1.524 m)   Wt 135 lb  (61.2 kg)   SpO2 98%   BMI 26.37 kg/m   Assessment and Plan: 1. Acute pansinusitis, recurrence not specified Continue advil sinus, fluids, add flonase - levofloxacin (LEVAQUIN) 500 MG tablet; Take 1 tablet (500 mg total) by mouth daily for 10 days.  Dispense: 10 tablet; Refill: 0 - predniSONE (DELTASONE) 10 MG tablet; Take 6 on day 1and 2, 5 on day 3 and 4, 4 on day 5 and 6 , 3 on day 7 and 8, 2 on day 9 and 10 and 1 on day 11 and 12 then stop.  Dispense: 42 tablet; Refill: 0   Meds ordered this encounter  Medications  . levofloxacin (LEVAQUIN) 500 MG tablet    Sig: Take 1 tablet (500 mg total) by mouth daily for 10 days.    Dispense:  10 tablet    Refill:  0  . predniSONE (DELTASONE) 10 MG tablet    Sig: Take 6 on day 1and 2, 5 on day 3 and 4, 4 on day 5 and 6 , 3 on day 7 and 8, 2 on day 9 and 10 and 1 on day 11 and 12 then stop.    Dispense:  42 tablet    Refill:  0    Partially dictated using Editor, commissioning. Any errors are unintentional.  Halina Maidens, MD [facility]  06/09/2017

## 2017-06-09 NOTE — Patient Instructions (Signed)
Continue all current over the counter and nebulizers  Add Flonase nasal spray

## 2017-10-31 ENCOUNTER — Encounter: Payer: Self-pay | Admitting: Internal Medicine

## 2017-10-31 ENCOUNTER — Ambulatory Visit (INDEPENDENT_AMBULATORY_CARE_PROVIDER_SITE_OTHER): Payer: Self-pay | Admitting: Internal Medicine

## 2017-10-31 VITALS — BP 104/80 | HR 107 | Temp 98.3°F | Ht 60.0 in | Wt 135.0 lb

## 2017-10-31 DIAGNOSIS — J0100 Acute maxillary sinusitis, unspecified: Secondary | ICD-10-CM

## 2017-10-31 MED ORDER — HYDROCODONE-HOMATROPINE 5-1.5 MG/5ML PO SYRP: 5.0000 mL | ORAL_SOLUTION | Freq: Four times a day (QID) | ORAL | 0 refills | Status: DC | PRN

## 2017-10-31 MED ORDER — ALBUTEROL SULFATE (2.5 MG/3ML) 0.083% IN NEBU: 2.5000 mg | INHALATION_SOLUTION | Freq: Four times a day (QID) | RESPIRATORY_TRACT | 12 refills | Status: DC | PRN

## 2017-10-31 MED ORDER — AMOXICILLIN-POT CLAVULANATE 875-125 MG PO TABS: 1.0000 | ORAL_TABLET | Freq: Two times a day (BID) | ORAL | 0 refills | Status: AC

## 2017-10-31 MED ORDER — PREDNISONE 10 MG PO TABS: ORAL_TABLET | ORAL | 0 refills | Status: DC

## 2017-10-31 NOTE — Progress Notes (Signed)
Date:  10/31/2017   Name:  Rachael Miller   DOB:  03-Apr-1971   MRN:  [id number]   Chief Complaint: URI URI   This is a new problem. The current episode started in the past 7 days. The problem has been gradually worsening. Associated symptoms include congestion, coughing, ear pain, headaches, neck pain, sinus pain, a sore throat, swollen glands and wheezing. Pertinent negatives include no chest pain. She has tried decongestant and inhaler use for the symptoms. The treatment provided no relief.      Review of Systems  Constitutional: Negative for chills, fatigue and fever.  HENT: Positive for congestion, ear pain, sinus pain and sore throat.   Eyes: Negative for visual disturbance.  Respiratory: Positive for cough and wheezing. Negative for shortness of breath.   Cardiovascular: Negative for chest pain and palpitations.  Musculoskeletal: Positive for neck pain.  Allergic/Immunologic: Negative for environmental allergies.  Neurological: Positive for headaches.  Hematological: Negative for adenopathy.    Patient Active Problem List   Diagnosis Date Noted  . Hx of ovarian cancer 04/12/2016  . Tobacco use disorder 04/12/2016  . 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 Asthma in adult, mild intermittent, uncomplicated 58/52/7782    Prior to Admission medications   Medication Sig Start Date End Date Taking? Authorizing Provider  albuterol (PROVENTIL HFA;VENTOLIN HFA) 108 (90 Base) MCG/ACT inhaler Inhale 2 puffs into the lungs every 6 (six) hours as needed for wheezing or shortness of breath. 01/14/17   Laban Emperor, PA-C  albuterol (PROVENTIL) (2.5 MG/3ML) 0.083% nebulizer solution Take 3 mLs (2.5 mg total) by nebulization every 6 (six) hours as needed for wheezing or shortness of breath. 01/14/17   Laban Emperor, PA-C  levothyroxine (SYNTHROID, LEVOTHROID) 112 MCG tablet Take 1 tablet (112 mcg total) by mouth daily. 06/05/17   Glean Hess, MD  predniSONE (DELTASONE)  10 MG tablet Take 6 on day 1and 2, 5 on day 3 and 4, 4 on day 5 and 6 , 3 on day 7 and 8, 2 on day 9 and 10 and 1 on day 11 and 12 then stop. 06/09/17   Glean Hess, MD    Allergies  Allergen Reactions  . Flagyl [Metronidazole] Hives    Past Surgical History:  Procedure Laterality Date  . CESAREAN SECTION    . THYROIDECTOMY  2007   goiter  . TOTAL VAGINAL HYSTERECTOMY  2000   ovarian cancer    Social History   Tobacco Use  . Smoking status: Current Some Day Smoker    Packs/day: 1.00    Years: 30.00    Pack years: 30.00  . Smokeless tobacco: Never Used  Substance Use Topics  . Alcohol use: No  . Drug use: No     Medication list has been reviewed and updated.  Current Meds  Medication Sig  . albuterol (PROVENTIL) (2.5 MG/3ML) 0.083% nebulizer solution Take 3 mLs (2.5 mg total) by nebulization every 6 (six) hours as needed for wheezing or shortness of breath.  . Dextromethorphan-guaiFENesin (TUSSIN COUGH DM PO) Take by mouth.  . fluticasone (FLONASE) 50 MCG/ACT nasal spray Place 2 sprays into both nostrils daily.  Marland Kitchen levothyroxine (SYNTHROID, LEVOTHROID) 112 MCG tablet Take 1 tablet (112 mcg total) by mouth daily.  . pseudoephedrine-acetaminophen (TYLENOL SINUS) 30-500 MG TABS tablet Take 1 tablet by mouth every 4 (four) hours as needed.  . triamcinolone cream (KENALOG) 0.1 % Apply topically.  . [DISCONTINUED] albuterol (PROVENTIL) (2.5 MG/3ML) 0.083% nebulizer solution Take 3 mLs (  2.5 mg total) by nebulization every 6 (six) hours as needed for wheezing or shortness of breath.  . [DISCONTINUED] predniSONE (DELTASONE) 10 MG tablet Take 6 on day 1and 2, 5 on day 3 and 4, 4 on day 5 and 6 , 3 on day 7 and 8, 2 on day 9 and 10 and 1 on day 11 and 12 then stop.    PHQ 2/9 Scores 05/03/2017  PHQ - 2 Score 0    Physical Exam  Constitutional: She is oriented to person, place, and time. She appears well-developed and well-nourished.  HENT:  Right Ear: External ear and ear  canal normal. Tympanic membrane is retracted. Tympanic membrane is not erythematous.  Left Ear: External ear and ear canal normal. Tympanic membrane is retracted. Tympanic membrane is not erythematous.  Nose: Right sinus exhibits maxillary sinus tenderness and frontal sinus tenderness. Left sinus exhibits maxillary sinus tenderness and frontal sinus tenderness.  Mouth/Throat: Uvula is midline and mucous membranes are normal. No oral lesions. Posterior oropharyngeal erythema present. No oropharyngeal exudate.  Neck: Trachea normal and phonation normal. No thyroid mass present.  Cardiovascular: Normal rate, regular rhythm and normal heart sounds.  Pulmonary/Chest: Breath sounds normal. She has no wheezes. She has no rales.  Lymphadenopathy:    She has no cervical adenopathy.  Neurological: She is alert and oriented to person, place, and time.    BP 104/80   Pulse (!) 107   Temp 98.3 F (36.8 C) (Oral)   Ht 5' (1.524 m)   Wt 135 lb (61.2 kg)   SpO2 97%   BMI 26.37 kg/m   Assessment and Plan: 1. Acute non-recurrent maxillary sinusitis Continue nebulizers and Advil, fluids - amoxicillin-clavulanate (AUGMENTIN) 875-125 MG tablet; Take 1 tablet by mouth 2 (two) times daily for 10 days.  Dispense: 20 tablet; Refill: 0 - HYDROcodone-homatropine (HYCODAN) 5-1.5 MG/5ML syrup; Take 5 mLs by mouth every 6 (six) hours as needed for cough.  Dispense: 120 mL; Refill: 0 - predniSONE (DELTASONE) 10 MG tablet; Take 6 on day 1, 5 on day 2, 4 on day 3, 3 on day 4, 2 on day 5 and 1 on day 1 then stop.  Dispense: 21 tablet; Refill: 0 - albuterol (PROVENTIL) (2.5 MG/3ML) 0.083% nebulizer solution; Take 3 mLs (2.5 mg total) by nebulization every 6 (six) hours as needed for wheezing or shortness of breath.  Dispense: 75 mL; Refill: 12   Meds ordered this encounter  Medications  . amoxicillin-clavulanate (AUGMENTIN) 875-125 MG tablet    Sig: Take 1 tablet by mouth 2 (two) times daily for 10 days.     Dispense:  20 tablet    Refill:  0  . HYDROcodone-homatropine (HYCODAN) 5-1.5 MG/5ML syrup    Sig: Take 5 mLs by mouth every 6 (six) hours as needed for cough.    Dispense:  120 mL    Refill:  0  . predniSONE (DELTASONE) 10 MG tablet    Sig: Take 6 on day 1, 5 on day 2, 4 on day 3, 3 on day 4, 2 on day 5 and 1 on day 1 then stop.    Dispense:  21 tablet    Refill:  0  . albuterol (PROVENTIL) (2.5 MG/3ML) 0.083% nebulizer solution    Sig: Take 3 mLs (2.5 mg total) by nebulization every 6 (six) hours as needed for wheezing or shortness of breath.    Dispense:  75 mL    Refill:  12    Partially dictated using Dragon  software. Any errors are unintentional.  Halina Maidens, MD [facility]  10/31/2017   There are no diagnoses linked to this encounter.

## 2017-12-19 ENCOUNTER — Encounter: Payer: Self-pay | Admitting: Emergency Medicine

## 2017-12-19 ENCOUNTER — Emergency Department
Admission: EM | Admit: 2017-12-19 | Discharge: 2017-12-19 | Disposition: A | Discharge status: Home / Self Care | Payer: Self-pay | Attending: Emergency Medicine | Admitting: Emergency Medicine

## 2017-12-19 ENCOUNTER — Emergency Department: Payer: Self-pay

## 2017-12-19 ENCOUNTER — Other Ambulatory Visit: Payer: Self-pay

## 2017-12-19 DIAGNOSIS — X500XXA Overexertion from strenuous movement or load, initial encounter: Secondary | ICD-10-CM | POA: Insufficient documentation

## 2017-12-19 DIAGNOSIS — S46811A Strain of other muscles, fascia and tendons at shoulder and upper arm level, right arm, initial encounter: Secondary | ICD-10-CM | POA: Insufficient documentation

## 2017-12-19 DIAGNOSIS — Z79899 Other long term (current) drug therapy: Secondary | ICD-10-CM | POA: Insufficient documentation

## 2017-12-19 DIAGNOSIS — F17210 Nicotine dependence, cigarettes, uncomplicated: Secondary | ICD-10-CM | POA: Insufficient documentation

## 2017-12-19 DIAGNOSIS — Y939 Activity, unspecified: Secondary | ICD-10-CM | POA: Insufficient documentation

## 2017-12-19 DIAGNOSIS — Y929 Unspecified place or not applicable: Secondary | ICD-10-CM | POA: Insufficient documentation

## 2017-12-19 DIAGNOSIS — Z8543 Personal history of malignant neoplasm of ovary: Secondary | ICD-10-CM | POA: Insufficient documentation

## 2017-12-19 DIAGNOSIS — Y999 Unspecified external cause status: Secondary | ICD-10-CM | POA: Insufficient documentation

## 2017-12-19 DIAGNOSIS — S46812A Strain of other muscles, fascia and tendons at shoulder and upper arm level, left arm, initial encounter: Secondary | ICD-10-CM

## 2017-12-19 MED ORDER — KETOROLAC TROMETHAMINE 10 MG PO TABS: 10.0000 mg | ORAL_TABLET | Freq: Four times a day (QID) | ORAL | 0 refills | Status: DC | PRN

## 2017-12-19 MED ORDER — OXYCODONE-ACETAMINOPHEN 5-325 MG PO TABS
1.0000 | ORAL_TABLET | Freq: Once | ORAL | Status: AC
  Administered 2017-12-19: 1 via ORAL
  Filled 2017-12-19: qty 1

## 2017-12-19 MED ORDER — CYCLOBENZAPRINE HCL 5 MG PO TABS: ORAL_TABLET | ORAL | 0 refills | Status: DC

## 2017-12-19 MED ORDER — TRAMADOL HCL 50 MG PO TABS: 50.0000 mg | ORAL_TABLET | Freq: Four times a day (QID) | ORAL | 0 refills | Status: DC | PRN

## 2017-12-19 MED ORDER — KETOROLAC TROMETHAMINE 30 MG/ML IJ SOLN
30.0000 mg | Freq: Once | INTRAMUSCULAR | Status: AC
  Administered 2017-12-19: 30 mg via INTRAMUSCULAR
  Filled 2017-12-19: qty 1

## 2017-12-19 MED ORDER — LIDOCAINE 5 % EX PTCH
1.0000 | MEDICATED_PATCH | CUTANEOUS | Status: DC
  Administered 2017-12-19: 1 via TRANSDERMAL
  Filled 2017-12-19: qty 1

## 2017-12-19 NOTE — ED Triage Notes (Signed)
Pt to ED from home c/o left shoulder and neck pain.  States moved heavy grill into pickup truck on Sunday and started having pain Monday evening.

## 2017-12-19 NOTE — ED Notes (Signed)
See triage note  Presents with pain to left shoulder since Monday  States she lifted something heavy on  Monday  No deformity noted   Good pulses noted

## 2017-12-19 NOTE — ED Provider Notes (Signed)
[facility] Emergency Department Provider Note  ____________________________________________  Time seen: Approximately 2:56 PM  I have reviewed the triage vital signs and the nursing notes.   HISTORY  Chief Complaint Shoulder Pain    HPI Rachael Miller is a 47 y.o. female that presents to the emergency department for evaluation of shoulder pain after picking up a 130 pound grill 2 days ago.  She is primarily having pain between her neck and shoulder.  She occasionally feels shooting pains going down her left arm.  She has tried icy hot, heat, ice, otc medications with minimal relief.  No headache, visual changes, numbness, tingling.   Past Medical History:  Diagnosis Date  . Chronic bronchitis ([place])   . History of thyroidectomy    right voice cord was removed during surgery  . Ovarian cancer ([place])    ovarion  . Pneumonia     Patient Active Problem List   Diagnosis Date Noted  . Hx of ovarian cancer 04/12/2016  . Tobacco use disorder 04/12/2016  . 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 Asthma in adult, mild intermittent, uncomplicated 15/17/6160    Past Surgical History:  Procedure Laterality Date  . CESAREAN SECTION    . SHOULDER SURGERY Right   . THYROIDECTOMY  2007   goiter  . TOTAL VAGINAL HYSTERECTOMY  2000   ovarian cancer    Prior to Admission medications   Medication Sig Start Date End Date Taking? Authorizing Provider  albuterol (PROVENTIL HFA;VENTOLIN HFA) 108 (90 Base) MCG/ACT inhaler Inhale 2 puffs into the lungs every 6 (six) hours as needed for wheezing or shortness of breath. Patient not taking: Reported on 10/31/2017 01/14/17   Laban Emperor, PA-C  albuterol (PROVENTIL) (2.5 MG/3ML) 0.083% nebulizer solution Take 3 mLs (2.5 mg total) by nebulization every 6 (six) hours as needed for wheezing or shortness of breath. 10/31/17   Glean Hess, MD  cyclobenzaprine (FLEXERIL) 5 MG tablet Take 1-2 tablets 3 times daily  as needed 12/19/17   Laban Emperor, PA-C  Dextromethorphan-guaiFENesin (TUSSIN COUGH DM PO) Take by mouth.    [provider]  fluticasone (FLONASE) 50 MCG/ACT nasal spray Place 2 sprays into both nostrils daily.    [provider]  HYDROcodone-homatropine (HYCODAN) 5-1.5 MG/5ML syrup Take 5 mLs by mouth every 6 (six) hours as needed for cough. 10/31/17   Glean Hess, MD  ketorolac (TORADOL) 10 MG tablet Take 1 tablet (10 mg total) by mouth every 6 (six) hours as needed. 12/19/17   Laban Emperor, PA-C  levothyroxine (SYNTHROID, LEVOTHROID) 112 MCG tablet Take 1 tablet (112 mcg total) by mouth daily. 06/05/17   Glean Hess, MD  predniSONE (DELTASONE) 10 MG tablet Take 6 on day 1, 5 on day 2, 4 on day 3, 3 on day 4, 2 on day 5 and 1 on day 1 then stop. 10/31/17   Glean Hess, MD  pseudoephedrine-acetaminophen (TYLENOL SINUS) 30-500 MG TABS tablet Take 1 tablet by mouth every 4 (four) hours as needed.    [provider]  traMADol (ULTRAM) 50 MG tablet Take 1 tablet (50 mg total) by mouth every 6 (six) hours as needed. 12/19/17 12/19/18  Laban Emperor, PA-C  triamcinolone cream (KENALOG) 0.1 % Apply topically. 10/09/17 10/09/18  [provider]    Allergies Flagyl [metronidazole]  Family History  Adopted: Yes    Social History Social History   Tobacco Use  . Smoking status: Current Some Day Smoker    Packs/day: 1.00  Years: 30.00    Pack years: 30.00  . Smokeless tobacco: Never Used  Substance Use Topics  . Alcohol use: Yes    Comment: occ.  . Drug use: No     Review of Systems  Constitutional: No fever/chills Cardiovascular: No chest pain. Respiratory:  No SOB. Gastrointestinal: No abdominal pain.  No nausea, no vomiting.  Musculoskeletal: Positive for neck and shoulder pain. Skin: Negative for rash, abrasions, lacerations, ecchymosis. Neurological: Negative for headaches, numbness or  tingling   ____________________________________________   PHYSICAL EXAM:  VITAL SIGNS: ED Triage Vitals  Enc Vitals Group     BP 12/19/17 1303 (!) 156/87     Pulse Rate 12/19/17 1303 88     Resp 12/19/17 1303 16     Temp 12/19/17 1303 98.7 F (37.1 C)     Temp Source 12/19/17 1303 Oral     SpO2 12/19/17 1303 100 %     Weight 12/19/17 1304 130 lb (59 kg)     Height 12/19/17 1304 5' (1.524 m)     Head Circumference --      Peak Flow --      Pain Score 12/19/17 1303 8     Pain Loc --      Pain Edu? --      Excl. in [place]? --      Constitutional: Alert and oriented. Well appearing and in no acute distress. Eyes: Conjunctivae are normal. PERRL. EOMI. Head: Atraumatic. ENT:      Ears:      Nose: No congestion/rhinnorhea.      Mouth/Throat: Mucous membranes are moist.  Neck: No stridor. No cervical spine tenderness to palpation. Cardiovascular: Normal rate, regular rhythm.  Good peripheral circulation. Symmetric radial pulses bilaterally. Respiratory: Normal respiratory effort without tachypnea or retractions. Lungs CTAB. Good air entry to the bases with no decreased or absent breath sounds. Musculoskeletal: Full range of motion to all extremities. No gross deformities appreciated. Pain with shoulder flexion. Pain with neck rotation and flexion.  Tenderness to palpation primarily over left trapezius muscle between neck and shoulder.  Strength equal in upper extremity bilaterally. Neurologic:  Normal speech and language. No gross focal neurologic deficits are appreciated.  Skin:  Skin is warm, dry and intact. No rash noted. Psychiatric: Mood and affect are normal. Speech and behavior are normal. Patient exhibits appropriate insight and judgement.   ____________________________________________   LABS (all labs ordered are listed, but only abnormal results are displayed)  Labs Reviewed - No data to  display ____________________________________________  EKG   ____________________________________________  RADIOLOGY Robinette Haines, personally viewed and evaluated these images (plain radiographs) as part of my medical decision making, as well as reviewing the written report by the radiologist.  Dg Cervical Spine Complete  Result Date: 12/19/2017 CLINICAL DATA:  Left-sided neck and shoulder pain. EXAM: CERVICAL SPINE - COMPLETE 4+ VIEW COMPARISON:  None. FINDINGS: The lateral view is diagnostic to the C7-T1 level. There is no acute fracture or subluxation. Vertebral body heights are preserved. Straightening of the normal cervical lordosis. Alignment is normal. Interveterbral disc spaces are maintained. Surgical clip in the left neck. Otherwise normal prevertebral soft tissues. IMPRESSION: Negative cervical spine radiographs. Electronically Signed   By: Titus Dubin M.D.   On: 12/19/2017 15:22   Dg Shoulder Left  Result Date: 12/19/2017 CLINICAL DATA:  LEFT shoulder and neck pain after moving a heavy grill into a pickup truck on Sunday, onset of pain Monday evening EXAM: LEFT SHOULDER - 2+ VIEW COMPARISON:  None FINDINGS: Osseous mineralization low normal. AC joint alignment normal. No acute fracture, dislocation, or bone destruction. Visualized LEFT ribs intact. IMPRESSION: Normal exam. Electronically Signed   By: Lavonia Dana M.D.   On: 12/19/2017 13:55    ____________________________________________    PROCEDURES  Procedure(s) performed:    Procedures    Medications  lidocaine (LIDODERM) 5 % 1 patch (1 patch Transdermal Patch Applied 12/19/17 1603)  oxyCODONE-acetaminophen (PERCOCET/ROXICET) 5-325 MG per tablet 1 tablet (1 tablet Oral Given 12/19/17 1511)  ketorolac (TORADOL) 30 MG/ML injection 30 mg (30 mg Intramuscular Given 12/19/17 1603)     ____________________________________________   INITIAL IMPRESSION / ASSESSMENT AND PLAN / ED COURSE  Pertinent labs & imaging  results that were available during my care of the patient were reviewed by me and considered in my medical decision making (see chart for details).  Review of the [place] CSRS was performed in accordance of the [place] prior to dispensing any controlled drugs.     Patient's diagnosis is consistent with muscle strain.  Vital signs and exam are reassuring.  Cervical x-ray and shoulder x-ray are negative for acute bony abnormalities.  Toradol and Percocet were given in ED.   Patient will be discharged home with prescriptions for Flexeril, Toradol, a short course of tramadol.   Patient is to follow up with PCP as directed. Patient is given ED precautions to return to the ED for any worsening or new symptoms.     ____________________________________________  FINAL CLINICAL IMPRESSION(S) / ED DIAGNOSES  Final diagnoses:  Strain of left trapezius muscle, initial encounter      NEW MEDICATIONS STARTED DURING THIS VISIT:  ED Discharge Orders        Ordered    ketorolac (TORADOL) 10 MG tablet  Every 6 hours PRN     12/19/17 1552    cyclobenzaprine (FLEXERIL) 5 MG tablet     12/19/17 1552    traMADol (ULTRAM) 50 MG tablet  Every 6 hours PRN     12/19/17 1552          This chart was dictated using voice recognition software/Dragon. Despite best efforts to proofread, errors can occur which can change the meaning. Any change was purely unintentional.    Laban Emperor, PA-C 12/19/17 Yevette Edwards    Lavonia Drafts, MD 12/20/17 [phone]

## 2018-03-10 ENCOUNTER — Other Ambulatory Visit: Payer: Self-pay | Admitting: Internal Medicine

## 2018-03-10 DIAGNOSIS — E890 Postprocedural hypothyroidism: Secondary | ICD-10-CM

## 2018-03-23 ENCOUNTER — Other Ambulatory Visit: Payer: Self-pay | Admitting: Internal Medicine

## 2018-03-23 ENCOUNTER — Ambulatory Visit (INDEPENDENT_AMBULATORY_CARE_PROVIDER_SITE_OTHER): Payer: Self-pay | Admitting: Internal Medicine

## 2018-03-23 ENCOUNTER — Encounter: Payer: Self-pay | Admitting: Internal Medicine

## 2018-03-23 VITALS — BP 126/80 | HR 86 | Ht 60.0 in | Wt 139.0 lb

## 2018-03-23 DIAGNOSIS — E890 Postprocedural hypothyroidism: Secondary | ICD-10-CM

## 2018-03-23 DIAGNOSIS — M5442 Lumbago with sciatica, left side: Secondary | ICD-10-CM

## 2018-03-23 DIAGNOSIS — M5440 Lumbago with sciatica, unspecified side: Secondary | ICD-10-CM | POA: Insufficient documentation

## 2018-03-23 NOTE — Progress Notes (Signed)
    Date:  03/23/2018   Name:  Rachael Miller   DOB:  10/10/1970   MRN:  [id number]   Chief Complaint: Hypothyroidism (Thyroid recheck.)  Thyroid Problem  Presents for follow-up visit. Patient reports no anxiety, constipation, depressed mood, diarrhea, fatigue, leg swelling, palpitations, weight gain or weight loss. The symptoms have been stable.    Review of Systems  Constitutional: Negative for chills, fatigue, fever, weight gain and weight loss.  HENT: Negative for sinus pressure.   Respiratory: Negative for cough, chest tightness, shortness of breath and wheezing.   Cardiovascular: Negative for chest pain and palpitations.  Gastrointestinal: Negative for abdominal pain, constipation and diarrhea.  Musculoskeletal: Positive for arthralgias and gait problem (sciatica of left side).  Neurological: Negative for dizziness and headaches.  Psychiatric/Behavioral: Negative for sleep disturbance. The patient is not nervous/anxious.     Patient Active Problem List   Diagnosis Date Noted  . Hx of ovarian cancer 04/12/2016  . Tobacco use disorder 04/12/2016  . 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 Asthma in adult, mild intermittent, uncomplicated 50/53/9767    Allergies  Allergen Reactions  . Flagyl [Metronidazole] Hives    Past Surgical History:  Procedure Laterality Date  . CESAREAN SECTION    . SHOULDER SURGERY Right   . THYROIDECTOMY  2007   goiter  . TOTAL VAGINAL HYSTERECTOMY  2000   ovarian cancer    Social History   Tobacco Use  . Smoking status: Current Some Day Smoker    Packs/day: 1.00    Years: 30.00    Pack years: 30.00  . Smokeless tobacco: Never Used  Substance Use Topics  . Alcohol use: Yes    Comment: occ.  . Drug use: No     Medication list has been reviewed and updated.  Current Meds  Medication Sig  . levothyroxine (SYNTHROID, LEVOTHROID) 112 MCG tablet TAKE 1 TABLET BY MOUTH ONCE DAILY    PHQ 2/9 Scores 05/03/2017  PHQ - 2  Score 0    Physical Exam  Constitutional: She is oriented to person, place, and time. She appears well-developed. No distress.  HENT:  Head: Normocephalic and atraumatic.  Neck: Normal range of motion. Neck supple. No thyromegaly present.  Cardiovascular: Normal rate, regular rhythm and normal heart sounds.  Pulmonary/Chest: Effort normal and breath sounds normal. No respiratory distress.  Abdominal: Soft.  Musculoskeletal: She exhibits no edema.  Lymphadenopathy:    She has no cervical adenopathy.  Neurological: She is alert and oriented to person, place, and time. She has normal reflexes. No sensory deficit. Gait normal.  Skin: Skin is warm and dry. No rash noted.  Psychiatric: She has a normal mood and affect. Her behavior is normal. Thought content normal.  Nursing note and vitals reviewed.   BP 126/80 (BP Location: Right Arm, Patient Position: Sitting, Cuff Size: Normal)   Pulse 86   Ht 5' (1.524 m)   Wt 139 lb (63 kg)   SpO2 99%   BMI 27.15 kg/m   Assessment and Plan: 1. Postoperative hypothyroidism Continue supplementation Get TSH done when insurance card arrives - TSH  2. Left-sided low back pain with left-sided sciatica, unspecified chronicity Continue advil - can alternate with tylenol See Ortho if worsening   Partially dictated using Editor, commissioning. Any errors are unintentional.  Halina Maidens, MD [facility]  03/23/2018

## 2018-06-04 ENCOUNTER — Emergency Department: Payer: Self-pay

## 2018-06-04 ENCOUNTER — Emergency Department
Admission: EM | Admit: 2018-06-04 | Discharge: 2018-06-04 | Disposition: A | Discharge status: Home / Self Care | Payer: Self-pay | Attending: Student in an Organized Health Care Education/Training Program | Admitting: Student in an Organized Health Care Education/Training Program

## 2018-06-04 ENCOUNTER — Encounter: Payer: Self-pay | Admitting: Emergency Medicine

## 2018-06-04 ENCOUNTER — Other Ambulatory Visit: Payer: Self-pay

## 2018-06-04 DIAGNOSIS — R05 Cough: Secondary | ICD-10-CM | POA: Insufficient documentation

## 2018-06-04 DIAGNOSIS — J40 Bronchitis, not specified as acute or chronic: Secondary | ICD-10-CM | POA: Insufficient documentation

## 2018-06-04 DIAGNOSIS — Z8543 Personal history of malignant neoplasm of ovary: Secondary | ICD-10-CM | POA: Insufficient documentation

## 2018-06-04 DIAGNOSIS — R5383 Other fatigue: Secondary | ICD-10-CM | POA: Insufficient documentation

## 2018-06-04 DIAGNOSIS — F17210 Nicotine dependence, cigarettes, uncomplicated: Secondary | ICD-10-CM | POA: Insufficient documentation

## 2018-06-04 DIAGNOSIS — Z79899 Other long term (current) drug therapy: Secondary | ICD-10-CM | POA: Insufficient documentation

## 2018-06-04 DIAGNOSIS — R0789 Other chest pain: Secondary | ICD-10-CM | POA: Insufficient documentation

## 2018-06-04 MED ORDER — AZITHROMYCIN 250 MG PO TABS: ORAL_TABLET | ORAL | 0 refills | Status: AC

## 2018-06-04 MED ORDER — BENZONATATE 100 MG PO CAPS
200.0000 mg | ORAL_CAPSULE | Freq: Once | ORAL | Status: AC
  Administered 2018-06-04: 200 mg via ORAL
  Filled 2018-06-04: qty 2

## 2018-06-04 MED ORDER — KETOROLAC TROMETHAMINE 60 MG/2ML IM SOLN
60.0000 mg | Freq: Once | INTRAMUSCULAR | Status: AC
  Administered 2018-06-04: 60 mg via INTRAMUSCULAR
  Filled 2018-06-04: qty 2

## 2018-06-04 MED ORDER — HYDROCOD POLST-CPM POLST ER 10-8 MG/5ML PO SUER: 5.0000 mL | Freq: Two times a day (BID) | ORAL | 0 refills | Status: DC

## 2018-06-04 MED ORDER — BENZONATATE 100 MG PO CAPS: 200.0000 mg | ORAL_CAPSULE | Freq: Three times a day (TID) | ORAL | 0 refills | Status: DC | PRN

## 2018-06-04 NOTE — ED Notes (Signed)
See triage note  presents with cough and nasal congestion  Afebrile on arrival  Feeling tired

## 2018-06-04 NOTE — ED Provider Notes (Signed)
[facility] Emergency Department Provider Note   ____________________________________________   First MD Initiated Contact with Patient 06/04/18 0815     (approximate)  I have reviewed the triage vital signs and the nursing notes.   HISTORY  Chief Complaint Nasal Congestion and Cough    HPI Rachael Miller is a 47 y.o. female patient presents with nasal and chest congestion for 4 days.  Patient also state has a nonproductive cough.  Patient complain of fatigue and increased chest wall pain when coughing.  Patient has taken a flu shot for this season.  Patient patient states nausea but denies vomiting diarrhea.  Patient rates her pain discomfort as 8/10.  Patient described pain is "achy".  No palliative measure for complaint.  Patient has a history of intermitting bronchitis.  Past Medical History:  Diagnosis Date  . Chronic bronchitis ([place])   . History of thyroidectomy    right voice cord was removed during surgery  . Ovarian cancer ([place])    ovarion  . Pneumonia     Patient Active Problem List   Diagnosis Date Noted  . Low back pain with sciatica 03/23/2018  . Hx of ovarian cancer 04/12/2016  . Tobacco use disorder 04/12/2016  . 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 Asthma in adult, mild intermittent, uncomplicated 34/91/7915    Past Surgical History:  Procedure Laterality Date  . CESAREAN SECTION    . SHOULDER SURGERY Right   . THYROIDECTOMY  2007   goiter  . TOTAL VAGINAL HYSTERECTOMY  2000   ovarian cancer    Prior to Admission medications   Medication Sig Start Date End Date Taking? Authorizing Provider  azithromycin (ZITHROMAX Z-PAK) 250 MG tablet Take 2 tablets (500 mg) on  Day 1,  followed by 1 tablet (250 mg) once daily on Days 2 through 5. 06/04/18 06/09/18  Sable Feil, PA-C  benzonatate (TESSALON PERLES) 100 MG capsule Take 2 capsules (200 mg total) by mouth 3 (three) times daily as needed. 06/04/18 06/04/19   Sable Feil, PA-C  chlorpheniramine-HYDROcodone (TUSSIONEX PENNKINETIC ER) 10-8 MG/5ML SUER Take 5 mLs by mouth 2 (two) times daily. 06/04/18   Sable Feil, PA-C  levothyroxine (SYNTHROID, LEVOTHROID) 112 MCG tablet TAKE 1 TABLET BY MOUTH ONCE DAILY 03/10/18   Glean Hess, MD    Allergies Flagyl [metronidazole]  Family History  Adopted: Yes    Social History Social History   Tobacco Use  . Smoking status: Current Some Day Smoker    Packs/day: 1.00    Years: 30.00    Pack years: 30.00  . Smokeless tobacco: Never Used  Substance Use Topics  . Alcohol use: Yes    Comment: occ.  . Drug use: No    Review of Systems  Constitutional: No fever/chills.  Fatigue and body aches. Eyes: No visual changes. ENT: No sore throat.  Nasal congestion postnasal drainage. Cardiovascular: Denies chest pain. Respiratory: Denies shortness of breath.  Chest congestion and nonproductive cough. Gastrointestinal: No abdominal pain.  No nausea, no vomiting.  No diarrhea.  No constipation. Genitourinary: Negative for dysuria. Musculoskeletal: Negative for back pain. Skin: Negative for rash. Neurological: Negative for headaches, focal weakness or numbness. Allergic/Immunilogical: Flagyl ____________________________________________   PHYSICAL EXAM:  VITAL SIGNS: ED Triage Vitals  Enc Vitals Group     BP 06/04/18 0734 (!) 129/92     Pulse Rate 06/04/18 0734 84     Resp 06/04/18 0734 16     Temp 06/04/18 0734 97.7 F (36.5  C)     Temp Source 06/04/18 0734 Oral     SpO2 06/04/18 0734 97 %     Weight 06/04/18 0736 134 lb (60.8 kg)     Height 06/04/18 0736 5' (1.524 m)     Head Circumference --      Peak Flow --      Pain Score 06/04/18 0735 8     Pain Loc --      Pain Edu? --      Excl. in [place]? --     Constitutional: Alert and oriented. Well appearing and in no acute distress. Eyes: Conjunctivae are normal. PERRL. EOMI. Head: Atraumatic. Nose: Edematous nasal turbinates  with clear rhinorrhea.   Mouth/Throat: Mucous membranes are moist.  Oropharynx non-erythematous. Neck: No stridor.  No cervical lymphadenopathy. Cardiovascular: Normal rate, regular rhythm. Grossly normal heart sounds.  Good peripheral circulation. Respiratory: Normal respiratory effort.  No retractions. Lungs with rhonchi breath sounds. Neurologic:  Normal speech and language. No gross focal neurologic deficits are appreciated. No gait instability. Skin:  Skin is warm, dry and intact. No rash noted. Psychiatric: Mood and affect are normal. Speech and behavior are normal.  ____________________________________________   LABS (all labs ordered are listed, but only abnormal results are displayed)  Labs Reviewed - No data to display ____________________________________________  EKG   ____________________________________________  RADIOLOGY  ED MD interpretation:    Official radiology report(s): Dg Chest 2 View  Result Date: 06/04/2018 CLINICAL DATA:  Head and chest congestion.  Dry cough. EXAM: CHEST - 2 VIEW COMPARISON:  01/14/2017. FINDINGS: Mediastinum hilar structures normal. Bilateral peribronchial cuffing noted suggesting bronchitis. No focal alveolar infiltrate. Tiny bilateral pleural effusions could not be excluded. No pneumothorax. IMPRESSION: 1.  Mild peribronchial cuffing noted suggesting bronchitis. 2.  Tiny bilateral pleural effusions can not be excluded. Electronically Signed   By: Marcello Moores  Register   On: 06/04/2018 08:38    ____________________________________________   PROCEDURES  Procedure(s) performed: None  Procedures  Critical Care performed: No  ____________________________________________   INITIAL IMPRESSION / ASSESSMENT AND PLAN / ED COURSE  As part of my medical decision making, I reviewed the following data within the [place]    Patient presents with 4 days of cough and congestion.  Chest x-ray consistent with bronchitis.   Patient given given discharge care instruction advised take medication as directed.  Patient given a work note for 2 days.  Patient vies follow-up PCP.      ____________________________________________   FINAL CLINICAL IMPRESSION(S) / ED DIAGNOSES  Final diagnoses:  Bronchitis     ED Discharge Orders         Ordered    azithromycin (ZITHROMAX Z-PAK) 250 MG tablet     06/04/18 0903    chlorpheniramine-HYDROcodone (TUSSIONEX PENNKINETIC ER) 10-8 MG/5ML SUER  2 times daily     06/04/18 0903    benzonatate (TESSALON PERLES) 100 MG capsule  3 times daily PRN     06/04/18 0350           Note:  This document was prepared using Dragon voice recognition software and may include unintentional dictation errors.    Sable Feil, PA-C 06/04/18 0938    Merlyn Lot, MD 06/04/18 [phone]

## 2018-06-04 NOTE — ED Triage Notes (Signed)
Patient reports head and chest congestion as well as dry cough x4 days. Patient also reports feeling increasingly tired and pain when she coughs.

## 2018-06-29 ENCOUNTER — Ambulatory Visit (INDEPENDENT_AMBULATORY_CARE_PROVIDER_SITE_OTHER): Payer: Self-pay | Admitting: Internal Medicine

## 2018-06-29 ENCOUNTER — Encounter: Payer: Self-pay | Admitting: Internal Medicine

## 2018-06-29 VITALS — BP 126/78 | HR 68 | Ht 60.0 in | Wt 138.0 lb

## 2018-06-29 DIAGNOSIS — M62838 Other muscle spasm: Secondary | ICD-10-CM

## 2018-06-29 MED ORDER — METHOCARBAMOL 500 MG PO TABS: 500.0000 mg | ORAL_TABLET | Freq: Four times a day (QID) | ORAL | 0 refills | Status: DC

## 2018-06-29 NOTE — Progress Notes (Signed)
Date:  06/29/2018   Name:  Rachael Miller   DOB:  06-18-1970   MRN:  [id number]   Chief Complaint: Shoulder Pain (X 3 weeks. Right shoulder blade pain. Can't reach, stretch, or carrying heavy things. Hurts to breathe at times too. )  Shoulder Pain   The pain is present in the right shoulder. This is a new problem. The current episode started 1 to 4 weeks ago. The problem occurs constantly. The problem has been gradually worsening. The quality of the pain is described as burning and aching. Associated symptoms include tingling. Pertinent negatives include no fever. The symptoms are aggravated by activity.    Review of Systems  Constitutional: Negative for chills, fatigue and fever.  Respiratory: Positive for cough (recovering from bronchitis). Negative for shortness of breath and wheezing.   Cardiovascular: Negative for chest pain, palpitations and leg swelling.  Musculoskeletal: Positive for arthralgias and myalgias.  Skin: Negative for rash.  Neurological: Positive for tingling. Negative for dizziness, seizures, weakness and headaches.  Psychiatric/Behavioral: Negative for dysphoric mood and sleep disturbance.    Patient Active Problem List   Diagnosis Date Noted  . Low back pain with sciatica 03/23/2018  . Hx of ovarian cancer 04/12/2016  . Tobacco use disorder 04/12/2016  . 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 Asthma in adult, mild intermittent, uncomplicated 46/50/3546    Allergies  Allergen Reactions  . Flagyl [Metronidazole] Hives    Past Surgical History:  Procedure Laterality Date  . CESAREAN SECTION    . SHOULDER SURGERY Right   . THYROIDECTOMY  2007   goiter  . TOTAL VAGINAL HYSTERECTOMY  2000   ovarian cancer    Social History   Tobacco Use  . Smoking status: Current Some Day Smoker    Packs/day: 1.00    Years: 30.00    Pack years: 30.00  . Smokeless tobacco: Never Used  Substance Use Topics  . Alcohol use: Yes    Comment: occ.  .  Drug use: No     Medication list has been reviewed and updated.  Current Meds  Medication Sig  . levothyroxine (SYNTHROID, LEVOTHROID) 112 MCG tablet TAKE 1 TABLET BY MOUTH ONCE DAILY    PHQ 2/9 Scores 06/29/2018 05/03/2017  PHQ - 2 Score 0 0    Physical Exam Vitals signs and nursing note reviewed.  Constitutional:      General: She is not in acute distress.    Appearance: She is well-developed.  HENT:     Head: Normocephalic and atraumatic.  Neck:     Musculoskeletal: Normal range of motion and neck supple. Muscular tenderness present.  Cardiovascular:     Rate and Rhythm: Normal rate and regular rhythm.  Pulmonary:     Effort: Pulmonary effort is normal. No respiratory distress.     Breath sounds: Normal breath sounds. No wheezing.  Musculoskeletal:     Right shoulder: She exhibits decreased range of motion.     Cervical back: She exhibits tenderness and spasm.  Skin:    General: Skin is warm and dry.     Findings: No rash.  Neurological:     Mental Status: She is alert and oriented to person, place, and time.     Sensory: Sensation is intact.     Motor: Motor function is intact.     Deep Tendon Reflexes: Reflexes are normal and symmetric.  Psychiatric:        Behavior: Behavior normal.        Thought Content:  Thought content normal.     BP 126/78   Pulse 68   Ht 5' (1.524 m)   Wt 138 lb (62.6 kg)   SpO2 98%   BMI 26.95 kg/m   Assessment and Plan: 1. Muscle spasm of right shoulder Continue CBD oil topical Ice 20 minutes 4 times per day Meloxicam daily - methocarbamol (ROBAXIN) 500 MG tablet; Take 1 tablet (500 mg total) by mouth 4 (four) times daily.  Dispense: 40 tablet; Refill: 0   Partially dictated using Editor, commissioning. Any errors are unintentional.  Halina Maidens, MD [facility]  06/29/2018

## 2018-06-29 NOTE — Patient Instructions (Signed)
Continue topical CBD oil Take Meloxicam daily Robaxin up to 4 times per day Ice up to 4 times per day

## 2018-07-16 ENCOUNTER — Other Ambulatory Visit: Payer: Self-pay | Admitting: Internal Medicine

## 2018-07-16 DIAGNOSIS — E890 Postprocedural hypothyroidism: Secondary | ICD-10-CM

## 2018-08-28 ENCOUNTER — Telehealth (INDEPENDENT_AMBULATORY_CARE_PROVIDER_SITE_OTHER): Payer: Self-pay | Admitting: Internal Medicine

## 2018-08-28 ENCOUNTER — Encounter: Payer: Self-pay | Admitting: Internal Medicine

## 2018-08-28 ENCOUNTER — Other Ambulatory Visit: Payer: Self-pay

## 2018-08-28 DIAGNOSIS — J40 Bronchitis, not specified as acute or chronic: Secondary | ICD-10-CM

## 2018-08-28 MED ORDER — HYDROCOD POLST-CPM POLST ER 10-8 MG/5ML PO SUER: 5.0000 mL | Freq: Two times a day (BID) | ORAL | 0 refills | Status: DC

## 2018-08-28 MED ORDER — AMOXICILLIN-POT CLAVULANATE 875-125 MG PO TABS: 1.0000 | ORAL_TABLET | Freq: Two times a day (BID) | ORAL | 0 refills | Status: DC

## 2018-08-28 NOTE — Progress Notes (Signed)
Virtual Visit via Telephone Note  I connected with Rachael Miller on 08/28/18 at  2:20 PM EDT by telephone and verified that I am speaking with the correct person using two identifiers.   I discussed the limitations, risks, security and privacy concerns of performing an evaluation and management service by telephone and the availability of in person appointments. I also discussed with the patient that there may be a patient responsible charge related to this service. The patient expressed understanding and agreed to proceed.   History of Present Illness: Cough: Patient complains of facial pain and pressure, nasal congestion, productive cough, sore throat and wheezing.  Symptoms began 3 days ago.  The cough is non-productive, with wheezing, paroxysmal and is aggravated by exercise Associated symptoms include:nothing else. Patient does not have new pets. Patient does not have a history of asthma. Patient does not have a history of environmental allergens. Patient does not have recent travel. Patient does have a history of smoking. She quit smoking 35 days ago. Patient  does not have previous Chest X-ray. She has recurrent bronchitis and was last treated in December 2019 with a Zpak.  Current Meds  Medication Sig  . levothyroxine (SYNTHROID, LEVOTHROID) 112 MCG tablet TAKE 1 TABLET BY MOUTH EVERY DAY  . methocarbamol (ROBAXIN) 500 MG tablet Take 1 tablet (500 mg total) by mouth 4 (four) times daily.   Allergies  Allergen Reactions  . Flagyl [Metronidazole] Hives      Observations/Objective: Pt alert and oriented, dry cough heard with congestion and mild voice changes.  No respiratory distress noted.   Assessment and Plan: 1. Bronchitis Recurrent with sinusitis - amoxicillin-clavulanate (AUGMENTIN) 875-125 MG tablet; Take 1 tablet by mouth 2 (two) times daily for 10 days.  Dispense: 20 tablet; Refill: 0 - chlorpheniramine-HYDROcodone (TUSSIONEX PENNKINETIC ER) 10-8 MG/5ML SUER; Take 5  mLs by mouth 2 (two) times daily for 8 days.  Dispense: 115 mL; Refill: 0   Follow Up Instructions: Return if sx worsen    I discussed the assessment and treatment plan with the patient. The patient was provided an opportunity to ask questions and all were answered. The patient agreed with the plan and demonstrated an understanding of the instructions.   The patient was advised to call back or seek an in-person evaluation if the symptoms worsen or if the condition fails to improve as anticipated.  I provided 10 minutes of non-face-to-face time during this encounter.   Halina Maidens, MD

## 2019-01-10 ENCOUNTER — Other Ambulatory Visit: Payer: Self-pay | Admitting: Internal Medicine

## 2019-01-10 DIAGNOSIS — E890 Postprocedural hypothyroidism: Secondary | ICD-10-CM

## 2019-01-28 ENCOUNTER — Other Ambulatory Visit: Payer: Self-pay

## 2019-01-28 ENCOUNTER — Ambulatory Visit (INDEPENDENT_AMBULATORY_CARE_PROVIDER_SITE_OTHER): Payer: Self-pay | Admitting: Internal Medicine

## 2019-01-28 ENCOUNTER — Encounter: Payer: Self-pay | Admitting: Internal Medicine

## 2019-01-28 VITALS — BP 112/76 | HR 76 | Ht 60.0 in | Wt 146.0 lb

## 2019-01-28 DIAGNOSIS — S76302A Unspecified injury of muscle, fascia and tendon of the posterior muscle group at thigh level, left thigh, initial encounter: Secondary | ICD-10-CM

## 2019-01-28 MED ORDER — TRAMADOL HCL 50 MG PO TABS
50.0000 mg | ORAL_TABLET | Freq: Four times a day (QID) | ORAL | 0 refills | Status: AC | PRN
Start: 2019-01-28 — End: 2019-02-02

## 2019-01-28 NOTE — Patient Instructions (Signed)
[facility] Monday - Friday 1:00 PM - 7:30 PM [address], [place] [address]

## 2019-01-28 NOTE — Progress Notes (Signed)
Date:  01/28/2019   Name:  Rachael Miller   DOB:  Oct 13, 1970   MRN:  [id number]   Chief Complaint: Leg Pain (Started in buttocks of left leg all the way down into the back of the knee. X 1.5 month in a half. After it started she h[place] 2 [place] ( 3 weeks ago ) and her leg made a snapping noise. Got better in between and then went to the beach and walked all on the beach. Leg is swelling on and off and turning black and blue on and off behind the knee. Tylenol and ibuprofen is not helping. Shoot stabbing pain. She said she has sciatica and this is not it. Not sleeping due to pain.)  Leg Pain  Incident onset: 6 weeks ago. The incident occurred at the park (while hiking up hill). The injury mechanism is unknown. The pain is present in the left leg. The quality of the pain is described as burning, cramping and shooting. The pain is at a severity of 6/10. The pain is moderate. The pain has been constant since onset. Associated symptoms include an inability to bear weight. Pertinent negatives include no loss of sensation or tingling. The symptoms are aggravated by palpation, weight bearing and movement. She has tried ice, heat, NSAIDs, rest and non-weight bearing for the symptoms. The treatment provided mild relief.    Review of Systems  Constitutional: Negative for chills, fatigue and fever.  Respiratory: Negative for chest tightness and shortness of breath.   Cardiovascular: Negative for chest pain, palpitations and leg swelling.  Musculoskeletal: Positive for arthralgias and myalgias.  Neurological: Negative for tingling.    Patient Active Problem List   Diagnosis Date Noted  . Low back pain with sciatica 03/23/2018  . Hx of ovarian cancer 04/12/2016  . Tobacco use disorder 04/12/2016  . 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 Asthma in adult, mild intermittent, uncomplicated [id number]    Allergies  Allergen Reactions  . Sulfamethoxazole-Trimethoprim Shortness Of  Breath  . Flagyl [Metronidazole] Hives    Past Surgical History:  Procedure Laterality Date  . CESAREAN SECTION    . SHOULDER SURGERY Right   . THYROIDECTOMY  2007   goiter  . TOTAL VAGINAL HYSTERECTOMY  2000   ovarian cancer    Social History   Tobacco Use  . Smoking status: Former Smoker    Packs/day: 1.00    Years: 30.00    Pack years: 30.00    Quit date: 10/24/2018    Years since quitting: 0.2  . Smokeless tobacco: Never Used  Substance Use Topics  . Alcohol use: Yes    Comment: occ.  . Drug use: No     Medication list has been reviewed and updated.  Current Meds  Medication Sig  . levothyroxine (SYNTHROID) 112 MCG tablet TAKE 1 TABLET BY MOUTH EVERY DAY  . methocarbamol (ROBAXIN) 500 MG tablet Take 1 tablet (500 mg total) by mouth 4 (four) times daily.    PHQ 2/9 Scores 01/28/2019 06/29/2018 05/03/2017  PHQ - 2 Score 0 0 0    BP Readings from Last 3 Encounters:  01/28/19 112/76  06/29/18 126/78  06/04/18 (!) 129/92    Physical Exam Vitals signs and nursing note reviewed.  Constitutional:      General: She is in acute distress.     Appearance: She is well-developed.  HENT:     Head: Normocephalic and atraumatic.  Cardiovascular:     Rate and Rhythm: Regular rhythm.  Pulmonary:  Effort: Pulmonary effort is normal. No respiratory distress.     Breath sounds: No wheezing or rhonchi.  Musculoskeletal: Normal range of motion.     Comments: Left leg normal in appearance Left calf soft, mildly tender but without cord, redness or warmth Posterior left knee tender to touch with out obvious swelling or bruising Left thigh tender posteriorly and anteriorly but not swollen or discolored compared to the right Left Homan's sign slightly positive DP and PT pulses 2+  Skin:    General: Skin is warm and dry.     Findings: No rash.  Neurological:     Mental Status: She is alert and oriented to person, place, and time.  Psychiatric:        Attention and  Perception: Attention normal.        Behavior: Behavior normal.        Thought Content: Thought content normal.     Wt Readings from Last 3 Encounters:  01/28/19 146 lb (66.2 kg)  06/29/18 138 lb (62.6 kg)  06/04/18 134 lb (60.8 kg)    BP 112/76   Pulse 76   Ht 5' (1.524 m)   Wt 146 lb (66.2 kg)   SpO2 96%   BMI 28.51 kg/m   Assessment and Plan: 1. Left hamstring injury, initial encounter Suspect hamstring tear/injury Will give Tramadol and recommend that she go to [facility] in [place] rest and heat to posterior thigh/knee - traMADol (ULTRAM) 50 MG tablet; Take 1 tablet (50 mg total) by mouth every 6 (six) hours as needed for up to 5 days.  Dispense: 20 tablet; Refill: 0   Partially dictated using Editor, commissioning. Any errors are unintentional.  Halina Maidens, MD [facility]  01/28/2019

## 2019-02-20 ENCOUNTER — Encounter: Payer: Self-pay | Admitting: Internal Medicine

## 2019-03-19 ENCOUNTER — Encounter: Payer: Self-pay | Admitting: Internal Medicine

## 2019-03-19 NOTE — Progress Notes (Deleted)
Date:  03/19/2019   Name:  Rachael Miller   DOB:  1971/01/24   MRN:  [id number]   Chief Complaint: No chief complaint on file. Rachael Miller is a 48 y.o. female who presents today for her Complete Annual Exam. She feels {DESC; WELL/FAIRLY WELL/POORLY:18703}. She reports exercising ***. She reports she is sleeping {DESC; WELL/FAIRLY WELL/POORLY:18703}.   Mammogram - ?none Pap not indicated  Thyroid Problem Presents for follow-up visit. Patient reports no anxiety, constipation, diarrhea, fatigue, palpitations or tremors. The symptoms have been stable.   Lab Results  Component Value Date   TSH 1.120 11/09/2016     Review of Systems  Constitutional: Negative for chills, fatigue and fever.  HENT: Negative for congestion, hearing loss, tinnitus, trouble swallowing and voice change.   Eyes: Negative for visual disturbance.  Respiratory: Negative for cough, chest tightness, shortness of breath and wheezing.   Cardiovascular: Negative for chest pain, palpitations and leg swelling.  Gastrointestinal: Negative for abdominal pain, constipation, diarrhea and vomiting.  Endocrine: Negative for polydipsia and polyuria.  Genitourinary: Negative for dysuria, frequency, genital sores, vaginal bleeding and vaginal discharge.  Musculoskeletal: Negative for arthralgias, gait problem and joint swelling.  Skin: Negative for color change and rash.  Neurological: Negative for dizziness, tremors, light-headedness and headaches.  Hematological: Negative for adenopathy. Does not bruise/bleed easily.  Psychiatric/Behavioral: Negative for dysphoric mood and sleep disturbance. The patient is not nervous/anxious.     Patient Active Problem List   Diagnosis Date Noted  . Low back pain with sciatica 03/23/2018  . Hx of ovarian cancer 04/12/2016  . Tobacco use disorder 04/12/2016  . 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 Asthma in adult, mild intermittent, uncomplicated [id number]    Allergies  Allergen Reactions  . Sulfamethoxazole-Trimethoprim Shortness Of Breath  . Flagyl [Metronidazole] Hives    Past Surgical History:  Procedure Laterality Date  . CESAREAN SECTION    . SHOULDER SURGERY Right   . THYROIDECTOMY  2007   goiter  . TOTAL VAGINAL HYSTERECTOMY  2000   ovarian cancer    Social History   Tobacco Use  . Smoking status: Former Smoker    Packs/day: 1.00    Years: 30.00    Pack years: 30.00    Quit date: 10/24/2018    Years since quitting: 0.4  . Smokeless tobacco: Never Used  Substance Use Topics  . Alcohol use: Yes    Comment: occ.  . Drug use: No     Medication list has been reviewed and updated.  No outpatient medications have been marked as taking for the 03/19/19 encounter (Appointment) with Glean Hess, MD.    [facility] 2/9 Scores 01/28/2019 06/29/2018 05/03/2017  PHQ - 2 Score 0 0 0    BP Readings from Last 3 Encounters:  01/28/19 112/76  06/29/18 126/78  06/04/18 (!) 129/92    Physical Exam Vitals signs and nursing note reviewed.  Constitutional:      General: She is not in acute distress.    Appearance: She is well-developed.  HENT:     Head: Normocephalic and atraumatic.     Right Ear: Tympanic membrane and ear canal normal.     Left Ear: Tympanic membrane and ear canal normal.     Nose:     Right Sinus: No maxillary sinus tenderness.     Left Sinus: No maxillary sinus tenderness.  Eyes:     General: No scleral icterus.       Right eye: No discharge.  Left eye: No discharge.     Conjunctiva/sclera: Conjunctivae normal.  Neck:     Musculoskeletal: Normal range of motion. No erythema.     Thyroid: No thyromegaly.     Vascular: No carotid bruit.  Cardiovascular:     Rate and Rhythm: Normal rate and regular rhythm.     Pulses: Normal pulses.     Heart sounds: Normal heart sounds.  Pulmonary:     Effort: Pulmonary effort is normal. No respiratory distress.     Breath sounds: No wheezing.  Chest:      Breasts:        Right: No mass, nipple discharge, skin change or tenderness.        Left: No mass, nipple discharge, skin change or tenderness.  Abdominal:     General: Bowel sounds are normal.     Palpations: Abdomen is soft.     Tenderness: There is no abdominal tenderness.  Musculoskeletal: Normal range of motion.  Lymphadenopathy:     Cervical: No cervical adenopathy.  Skin:    General: Skin is warm and dry.     Findings: No rash.  Neurological:     Mental Status: She is alert and oriented to person, place, and time.     Cranial Nerves: No cranial nerve deficit.     Sensory: No sensory deficit.     Deep Tendon Reflexes: Reflexes are normal and symmetric.  Psychiatric:        Speech: Speech normal.        Behavior: Behavior normal.        Thought Content: Thought content normal.     Wt Readings from Last 3 Encounters:  01/28/19 146 lb (66.2 kg)  06/29/18 138 lb (62.6 kg)  06/04/18 134 lb (60.8 kg)    There were no vitals taken for this visit.  Assessment and Plan:

## 2019-04-19 ENCOUNTER — Other Ambulatory Visit: Payer: Self-pay | Admitting: Internal Medicine

## 2019-04-19 DIAGNOSIS — E890 Postprocedural hypothyroidism: Secondary | ICD-10-CM

## 2019-04-24 ENCOUNTER — Ambulatory Visit (INDEPENDENT_AMBULATORY_CARE_PROVIDER_SITE_OTHER): Payer: Self-pay | Admitting: Internal Medicine

## 2019-04-24 ENCOUNTER — Other Ambulatory Visit: Payer: Self-pay

## 2019-04-24 ENCOUNTER — Encounter: Payer: Self-pay | Admitting: Internal Medicine

## 2019-04-24 VITALS — Ht 60.0 in | Wt 146.0 lb

## 2019-04-24 DIAGNOSIS — J0101 Acute recurrent maxillary sinusitis: Secondary | ICD-10-CM

## 2019-04-24 DIAGNOSIS — E890 Postprocedural hypothyroidism: Secondary | ICD-10-CM

## 2019-04-24 DIAGNOSIS — R059 Cough, unspecified: Secondary | ICD-10-CM

## 2019-04-24 DIAGNOSIS — R05 Cough: Secondary | ICD-10-CM

## 2019-04-24 MED ORDER — AMOXICILLIN-POT CLAVULANATE 875-125 MG PO TABS: 1.0000 | ORAL_TABLET | Freq: Two times a day (BID) | ORAL | 0 refills | Status: AC

## 2019-04-24 MED ORDER — HYDROCOD POLST-CPM POLST ER 10-8 MG/5ML PO SUER: 5.0000 mL | Freq: Every evening | ORAL | 0 refills | Status: AC | PRN

## 2019-04-24 NOTE — Progress Notes (Signed)
Date:  04/24/2019   Name:  Rachael Miller   DOB:  Sep 01, 1970   MRN:  [id number]  I connected with this patient, [place], by telephone at the patient's car in our parking lot.  I verified that I am speaking with the correct person using two identifiers. This visit was conducted via telephone due to the Covid-19 outbreak from my office at [facility] in [place], [place]. I discussed the limitations, risks, security and privacy concerns of performing an evaluation and management service by telephone. I also discussed with the patient that there may be a patient responsible charge related to this service. The patient expressed understanding and agreed to proceed.  Chief Complaint: Sore Throat (Sxs started last Tuesday. Feels like have URI. Cough mostly at night- no sleep. No production. Throat pain. Sinus pressure. )  Cough This is a new problem. The current episode started 1 to 4 weeks ago (10 days ago). The problem has been gradually worsening. The problem occurs every few minutes. The cough is productive of sputum (in the AM). Associated symptoms include nasal congestion, a sore throat and shortness of breath. Pertinent negatives include no chills, ear pain, fever, headaches, rash or wheezing. The symptoms are aggravated by lying down. She has tried OTC cough suppressant for the symptoms.  Thyroid Problem Presents for follow-up visit. Patient reports no anxiety, constipation, depressed mood, palpitations or weight gain. The symptoms have been stable.    No results found for: CREATININE, BUN, NA, K, CL, CO2 No results found for: CHOL, HDL, LDLCALC, LDLDIRECT, TRIG, CHOLHDL Lab Results  Component Value Date   TSH 1.120 11/09/2016   No results found for: HGBA1C   Review of Systems  Constitutional: Negative for chills, fever, unexpected weight change and weight gain.  HENT: Positive for sore throat. Negative for ear pain and voice change.   Respiratory: Positive for  cough and shortness of breath. Negative for wheezing.   Cardiovascular: Negative for palpitations.  Gastrointestinal: Negative for constipation.  Skin: Negative for rash.  Neurological: Negative for dizziness and headaches.  Psychiatric/Behavioral: Negative for dysphoric mood and sleep disturbance (due to cough). The patient is not nervous/anxious.     Patient Active Problem List   Diagnosis Date Noted  . Low back pain with sciatica 03/23/2018  . Hx of ovarian cancer 04/12/2016  . Tobacco use disorder 04/12/2016  . 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 Asthma in adult, mild intermittent, uncomplicated [id number]    Allergies  Allergen Reactions  . Sulfamethoxazole-Trimethoprim Shortness Of Breath  . Flagyl [Metronidazole] Hives    Past Surgical History:  Procedure Laterality Date  . CESAREAN SECTION    . SHOULDER SURGERY Right   . THYROIDECTOMY  2007   goiter  . TOTAL VAGINAL HYSTERECTOMY  2000   ovarian cancer    Social History   Tobacco Use  . Smoking status: Former Smoker    Packs/day: 1.00    Years: 30.00    Pack years: 30.00    Quit date: 10/24/2018    Years since quitting: 0.4  . Smokeless tobacco: Never Used  Substance Use Topics  . Alcohol use: Yes    Comment: occ.  . Drug use: No     Medication list has been reviewed and updated.  Current Meds  Medication Sig  . levothyroxine (SYNTHROID) 112 MCG tablet TAKE 1 TABLET BY MOUTH EVERY DAY    PHQ 2/9 Scores 04/24/2019 01/28/2019 06/29/2018 05/03/2017  PHQ - 2 Score 0 0 0 0  PHQ- 9 Score 6 - - -    BP Readings from Last 3 Encounters:  01/28/19 112/76  06/29/18 126/78  06/04/18 (!) 129/92    Physical Exam Pulmonary:     Comments: Occasional dry cough during the call Voice with nasal quality No dyspnea noted Neurological:     Mental Status: She is alert.  Psychiatric:        Attention and Perception: Attention normal.        Mood and Affect: Mood normal.        Speech: Speech normal.         Cognition and Memory: Cognition normal.     Wt Readings from Last 3 Encounters:  04/24/19 146 lb (66.2 kg)  01/28/19 146 lb (66.2 kg)  06/29/18 138 lb (62.6 kg)    Ht 5' (1.524 m)   Wt 146 lb (66.2 kg)   BMI 28.51 kg/m   Assessment and Plan: 1. Acute recurrent maxillary sinusitis Continue Robitussin Dm during the day Flonase nasal spray - amoxicillin-clavulanate (AUGMENTIN) 875-125 MG tablet; Take 1 tablet by mouth 2 (two) times daily for 10 days.  Dispense: 20 tablet; Refill: 0  2. Cough Tussionex at night - chlorpheniramine-HYDROcodone (TUSSIONEX PENNKINETIC ER) 10-8 MG/5ML SUER; Take 5 mLs by mouth at bedtime as needed for up to 8 days for cough.  Dispense: 115 mL; Refill: 0  3. Postoperative hypothyroidism Continue supplementation Get labs done next week once illness improved - TSH + free T4; Future - TSH + free T4  I spent 10 minutes on this encounter. Partially dictated using Editor, commissioning. Any errors are unintentional.  Halina Maidens, MD [facility]  04/24/2019

## 2019-05-08 ENCOUNTER — Telehealth: Payer: Self-pay

## 2019-05-08 NOTE — Telephone Encounter (Signed)
-----   Message from Glean Hess, MD sent at 05/06/2019  9:33 AM EST ----- Regarding: labs Please remind pt to get her thyroid labs done. -Dr. Jacinto Reap ----- Message ----- From: SYSTEM Sent: 04/29/2019  12:03 AM EST To: Glean Hess, MD

## 2019-05-08 NOTE — Telephone Encounter (Signed)
Tried calling pt several times to remind her of needing to get labs done. Patient VM not set up. Unable to reach her at this time.

## 2019-07-19 ENCOUNTER — Other Ambulatory Visit: Payer: Self-pay | Admitting: Internal Medicine

## 2019-07-19 DIAGNOSIS — E890 Postprocedural hypothyroidism: Secondary | ICD-10-CM

## 2019-07-22 NOTE — Telephone Encounter (Signed)
Could not reach patient to leave a message to call back.

## 2019-07-31 ENCOUNTER — Ambulatory Visit: Payer: Self-pay | Admitting: Internal Medicine

## 2019-08-13 ENCOUNTER — Ambulatory Visit: Payer: Self-pay | Admitting: Internal Medicine

## 2019-09-03 ENCOUNTER — Other Ambulatory Visit: Payer: Self-pay

## 2019-09-03 ENCOUNTER — Emergency Department
Admission: EM | Admit: 2019-09-03 | Discharge: 2019-09-03 | Disposition: A | Discharge status: Home / Self Care | Payer: Self-pay | Attending: Emergency Medicine | Admitting: Emergency Medicine

## 2019-09-03 ENCOUNTER — Emergency Department: Payer: Self-pay

## 2019-09-03 ENCOUNTER — Telehealth: Payer: Self-pay

## 2019-09-03 ENCOUNTER — Encounter: Payer: Self-pay | Admitting: Emergency Medicine

## 2019-09-03 DIAGNOSIS — Z20822 Contact with and (suspected) exposure to covid-19: Secondary | ICD-10-CM | POA: Insufficient documentation

## 2019-09-03 DIAGNOSIS — Z87891 Personal history of nicotine dependence: Secondary | ICD-10-CM | POA: Insufficient documentation

## 2019-09-03 DIAGNOSIS — Z8543 Personal history of malignant neoplasm of ovary: Secondary | ICD-10-CM | POA: Insufficient documentation

## 2019-09-03 DIAGNOSIS — R0789 Other chest pain: Secondary | ICD-10-CM | POA: Insufficient documentation

## 2019-09-03 DIAGNOSIS — J42 Unspecified chronic bronchitis: Secondary | ICD-10-CM | POA: Insufficient documentation

## 2019-09-03 DIAGNOSIS — R079 Chest pain, unspecified: Secondary | ICD-10-CM

## 2019-09-03 LAB — BASIC METABOLIC PANEL
Anion gap: 11 (ref 5–15)
BUN: 13 mg/dL (ref 6–20)
CO2: 23 mmol/L (ref 22–32)
Calcium: 8.2 mg/dL — ABNORMAL LOW (ref 8.9–10.3)
Chloride: 104 mmol/L (ref 98–111)
Creatinine, Ser: 0.97 mg/dL (ref 0.44–1.00)
GFR calc Af Amer: >60 mL/min (ref >60)
GFR calc non Af Amer: >60 mL/min (ref >60)
Glucose, Bld: 130 mg/dL — ABNORMAL HIGH (ref 70–99)
Potassium: 3.8 mmol/L (ref 3.5–5.1)
Sodium: 138 mmol/L (ref 135–145)

## 2019-09-03 LAB — CBC
HCT: 42.8 % (ref 36.0–46.0)
Hemoglobin: 14.6 g/dL (ref 12.0–15.0)
MCH: 32.4 pg (ref 26.0–34.0)
MCHC: 34.1 g/dL (ref 30.0–36.0)
MCV: 94.9 fL (ref 80.0–100.0)
Platelets: 281 10*3/uL (ref 150–400)
RBC: 4.51 MIL/uL (ref 3.87–5.11)
RDW: 12.5 % (ref 11.5–15.5)
WBC: 7.5 10*3/uL (ref 4.0–10.5)
nRBC: 0 % (ref 0.0–0.2)

## 2019-09-03 LAB — RESPIRATORY PANEL BY RT PCR (FLU A&B, COVID)
Influenza A by PCR: NEGATIVE
Influenza B by PCR: NEGATIVE
SARS Coronavirus 2 by RT PCR: NEGATIVE

## 2019-09-03 LAB — TROPONIN I (HIGH SENSITIVITY): Troponin I (High Sensitivity): 3 ng/L (ref <18)

## 2019-09-03 MED ORDER — HYDROCOD POLST-CPM POLST ER 10-8 MG/5ML PO SUER: 5.0000 mL | Freq: Two times a day (BID) | ORAL | 0 refills | Status: DC

## 2019-09-03 MED ORDER — IOHEXOL 350 MG/ML SOLN
75.0000 mL | Freq: Once | INTRAVENOUS | Status: AC | PRN
  Administered 2019-09-03: 75 mL via INTRAVENOUS
  Filled 2019-09-03: qty 75

## 2019-09-03 NOTE — ED Notes (Signed)
Pt alert and oriented X 4, stable for discharge. RR even and unlabored, color WNL. Discussed discharge instructions and follow up when appropriate. Instructed to follow up with ER for any life threatening symptoms or concerns that patient or family of patient may have  

## 2019-09-03 NOTE — ED Triage Notes (Signed)
Pt c/o left sided chest pain that radiates around to the mid axillary and shoulder.  Pain has been present X 2 months per pt.  Came today because called PCP and they told her to come to ED. No fever. Pt admits to cough and was exposed to covid last Saturday; has rapid test scheduled for tomorrow.

## 2019-09-03 NOTE — ED Provider Notes (Signed)
[facility] Emergency Department Provider Note       Time seen: ----------------------------------------- 5:15 PM on 09/03/2019 -----------------------------------------   I have reviewed the triage vital signs and the nursing notes.  HISTORY   Chief Complaint Chest Pain    HPI Rachael Miller is a 49 y.o. female with a history of chronic bronchitis, ovarian cancer, pneumonia who presents to the ED for left-sided chest pain that radiates around to the mid axillary and shoulder region.  This pain has been there for 2 months, nothing is making it better.  Patient reports she has chronic bronchitis has not been coughing more than normal.  Discomfort is 7 out of 10.  Past Medical History:  Diagnosis Date  . Chronic bronchitis ([place])   . History of thyroidectomy    right voice cord was removed during surgery  . Ovarian cancer ([place])    ovarion  . Pneumonia     Patient Active Problem List   Diagnosis Date Noted  . Low back pain with sciatica 03/23/2018  . Hx of ovarian cancer 04/12/2016  . Tobacco use disorder 04/12/2016  . 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 Asthma in adult, mild intermittent, uncomplicated [id number]    Past Surgical History:  Procedure Laterality Date  . CESAREAN SECTION    . SHOULDER SURGERY Right   . THYROIDECTOMY  2007   goiter  . TOTAL VAGINAL HYSTERECTOMY  2000   ovarian cancer    Allergies Sulfamethoxazole-trimethoprim and Flagyl [metronidazole]  Social History Social History   Tobacco Use  . Smoking status: Former Smoker    Packs/day: 1.00    Years: 30.00    Pack years: 30.00    Quit date: 10/24/2018    Years since quitting: 0.8  . Smokeless tobacco: Never Used  Substance Use Topics  . Alcohol use: Yes    Comment: occ.  . Drug use: No    Review of Systems Constitutional: Negative for fever. Cardiovascular: Positive for chest pain Respiratory: Negative for shortness of breath.  Positive for  chronic cough Gastrointestinal: Negative for abdominal pain, vomiting and diarrhea. Musculoskeletal: Negative for back pain. Skin: Negative for rash. Neurological: Negative for headaches, focal weakness or numbness.  All systems negative/normal/unremarkable except as stated in the HPI  ____________________________________________   PHYSICAL EXAM:  VITAL SIGNS: ED Triage Vitals  Enc Vitals Group     BP 09/03/19 1610 (!) 145/98     Pulse Rate 09/03/19 1610 100     Resp 09/03/19 1610 16     Temp 09/03/19 1610 98.8 F (37.1 C)     Temp Source 09/03/19 1610 Oral     SpO2 09/03/19 1610 97 %     Weight 09/03/19 1609 142 lb (64.4 kg)     Height 09/03/19 1609 5' (1.524 m)     Head Circumference --      Peak Flow --      Pain Score 09/03/19 1609 7     Pain Loc --      Pain Edu? --      Excl. in [place]? --     Constitutional: Alert and oriented. Well appearing and in no distress. Eyes: Conjunctivae are normal. Normal extraocular movements. Cardiovascular: Normal rate, regular rhythm. No murmurs, rubs, or gallops. Respiratory: Normal respiratory effort without tachypnea nor retractions. Breath sounds are clear and equal bilaterally. No wheezes/rales/rhonchi. Gastrointestinal: Soft and nontender. Normal bowel sounds Musculoskeletal: Nontender with normal range of motion in extremities. No lower extremity tenderness nor edema. Neurologic:  Normal  speech and language. No gross focal neurologic deficits are appreciated.  Skin:  Skin is warm, dry and intact. No rash noted. Psychiatric: Mood and affect are normal. Speech and behavior are normal.  ____________________________________________  EKG: Interpreted by me.  Sinus rhythm with rate of 100 bpm, nonspecific ST and T wave abnormalities, normal QT  ____________________________________________  ED COURSE:  As part of my medical decision making, I reviewed the following data within the [place] History obtained from  family if available, nursing notes, old chart and ekg, as well as notes from prior ED visits. Patient presented for left-sided chest pain, we will assess with labs and imaging as indicated at this time.   Procedures  [place] was evaluated in Emergency Department on 09/03/2019 for the symptoms described in the history of present illness. She was evaluated in the context of the global COVID-19 pandemic, which necessitated consideration that the patient might be at risk for infection with the SARS-CoV-2 virus that causes COVID-19. Institutional protocols and algorithms that pertain to the evaluation of patients at risk for COVID-19 are in a state of rapid change based on information released by regulatory bodies including the CDC and federal and state organizations. These policies and algorithms were followed during the patient's care in the ED.  ____________________________________________   LABS (pertinent positives/negatives)  Labs Reviewed  RESPIRATORY PANEL BY RT PCR (FLU A&B, COVID)  CBC  BASIC METABOLIC PANEL  TROPONIN I (HIGH SENSITIVITY)    RADIOLOGY Images were viewed by me CXR IMPRESSION: Stable mild to moderate chronic bronchitic changes. No acute abnormality. CT angiogram of the chest IMPRESSION:  1. No evidence of pulmonary embolus.  2. No acute intrathoracic process.  ____________________________________________   DIFFERENTIAL DIAGNOSIS   Bronchitis, spasm, strain, shingles, PE, lung cancer, MI  FINAL ASSESSMENT AND PLAN  Chest pain, bronchitis   Plan: The patient had presented for chronic left-sided chest pain in the setting of chronic bronchitis. Patient's labs were unremarkable. Patient's imaging did not reveal any acute process.  No clear etiology for her symptoms.  She is cleared for outpatient follow-up.   Laurence Aly, MD    Note: This note was generated in part or whole with voice recognition software. Voice recognition is usually  quite accurate but there are transcription errors that can and very often do occur. I apologize for any typographical errors that were not detected and corrected.     Earleen Newport, MD 09/03/19 [phone]

## 2019-09-03 NOTE — Telephone Encounter (Signed)
Patient called saying she was exposed to Rachael Miller on Friday. She is now experiencing symptoms of SOB, and heaviness in her chest. She is being tested tomorrow for Covid.  Told her this is a good plan and they will decide next steps based off her covid results.  - CM

## 2019-10-30 ENCOUNTER — Encounter: Payer: Self-pay | Admitting: Internal Medicine

## 2019-10-30 ENCOUNTER — Ambulatory Visit (INDEPENDENT_AMBULATORY_CARE_PROVIDER_SITE_OTHER): Payer: Self-pay | Admitting: Internal Medicine

## 2019-10-30 ENCOUNTER — Other Ambulatory Visit: Payer: Self-pay

## 2019-10-30 VITALS — BP 102/66 | HR 104 | Temp 98.1°F | Ht 60.0 in | Wt 138.0 lb

## 2019-10-30 DIAGNOSIS — E890 Postprocedural hypothyroidism: Secondary | ICD-10-CM

## 2019-10-30 DIAGNOSIS — L03314 Cellulitis of groin: Secondary | ICD-10-CM

## 2019-10-30 MED ORDER — LEVOTHYROXINE SODIUM 112 MCG PO TABS: 112.0000 ug | ORAL_TABLET | Freq: Every day | ORAL | 1 refills | Status: DC

## 2019-10-30 MED ORDER — AMOXICILLIN-POT CLAVULANATE 875-125 MG PO TABS: 1.0000 | ORAL_TABLET | Freq: Two times a day (BID) | ORAL | 0 refills | Status: AC

## 2019-10-30 NOTE — Progress Notes (Signed)
Date:  10/30/2019   Name:  Rachael Miller   DOB:  1970/12/10   MRN:  [id number]   Chief Complaint: Follow-up (hyperthyroid , ingrown hair in bikini line, it popped and had a bad smelled, its still red and "oozing", green pus)  Immunization History  Administered Date(s) Administered  . Influenza,inj,Quad PF,6+ Mos 04/12/2016  . Influenza-Unspecified 03/25/2017, 04/25/2018, 03/26/2019    Thyroid Problem Presents for follow-up visit. Patient reports no constipation, depressed mood, diaphoresis, fatigue, hair loss, leg swelling or palpitations. The symptoms have been stable.   Ingrown infected hair - had a swollen area in the left groin from shaving.  Yesterday it started draining pus.  It is still tender and draining a small amount.  No fever or chills.  She has been applying TAO.  Lab Results  Component Value Date   CREATININE 0.97 09/03/2019   BUN 13 09/03/2019   NA 138 09/03/2019   K 3.8 09/03/2019   CL 104 09/03/2019   CO2 23 09/03/2019   No results found for: CHOL, HDL, LDLCALC, LDLDIRECT, TRIG, CHOLHDL Lab Results  Component Value Date   TSH 1.120 11/09/2016   No results found for: HGBA1C Lab Results  Component Value Date   WBC 7.5 09/03/2019   HGB 14.6 09/03/2019   HCT 42.8 09/03/2019   MCV 94.9 09/03/2019   PLT 281 09/03/2019   No results found for: ALT, AST, GGT, ALKPHOS, BILITOT   Review of Systems  Constitutional: Negative for diaphoresis and fatigue.  HENT: Negative for sore throat and trouble swallowing.   Respiratory: Negative for chest tightness and shortness of breath.   Cardiovascular: Negative for chest pain and palpitations.  Gastrointestinal: Negative for constipation.  Neurological: Negative for dizziness, light-headedness and headaches.    Patient Active Problem List   Diagnosis Date Noted  . Low back pain with sciatica 03/23/2018  . Hx of ovarian cancer 04/12/2016  . Tobacco use disorder 04/12/2016  . Postoperative  hypothyroidism 04/12/2016  . Asthma in adult, mild intermittent, uncomplicated [id number]    Allergies  Allergen Reactions  . Sulfamethoxazole-Trimethoprim Shortness Of Breath  . Flagyl [Metronidazole] Hives    Past Surgical History:  Procedure Laterality Date  . CESAREAN SECTION    . SHOULDER SURGERY Right   . THYROIDECTOMY  2007   goiter  . TOTAL VAGINAL HYSTERECTOMY  2000   ovarian cancer    Social History   Tobacco Use  . Smoking status: Current Every Day Smoker    Packs/day: 0.25    Years: 30.00    Pack years: 7.50    Last attempt to quit: 10/24/2018    Years since quitting: 1.0  . Smokeless tobacco: Never Used  Substance Use Topics  . Alcohol use: Yes    Comment: occ.  . Drug use: No     Medication list has been reviewed and updated.  Current Meds  Medication Sig  . levothyroxine (SYNTHROID) 112 MCG tablet TAKE 1 TABLET BY MOUTH EVERY DAY    PHQ 2/9 Scores 10/30/2019 04/24/2019 01/28/2019 06/29/2018  PHQ - 2 Score 0 0 0 0  PHQ- 9 Score 0 6 - -    BP Readings from Last 3 Encounters:  10/30/19 102/66  09/03/19 134/88  01/28/19 112/76    Physical Exam Vitals and nursing note reviewed.  Constitutional:      General: She is not in acute distress.    Appearance: Normal appearance. She is well-developed.  HENT:     Head: Normocephalic and atraumatic.  Neck:     Vascular: No carotid bruit.  Cardiovascular:     Rate and Rhythm: Normal rate and regular rhythm.     Pulses: Normal pulses.     Heart sounds: No murmur.  Pulmonary:     Effort: Pulmonary effort is normal. No respiratory distress.     Breath sounds: No wheezing or rhonchi.  Musculoskeletal:        General: Normal range of motion.     Cervical back: Normal range of motion. No tenderness.     Right lower leg: No edema.     Left lower leg: No edema.  Skin:    General: Skin is warm and dry.     Findings: No rash.          Comments: Open abscess with induration 1 cm surrounding - no odor,  drainage or bleeding  Neurological:     Mental Status: She is alert and oriented to person, place, and time.  Psychiatric:        Behavior: Behavior normal.        Thought Content: Thought content normal.     Wt Readings from Last 3 Encounters:  10/30/19 138 lb (62.6 kg)  09/03/19 142 lb (64.4 kg)  04/24/19 146 lb (66.2 kg)    BP 102/66   Pulse (!) 104   Temp 98.1 F (36.7 C) (Oral)   Ht 5' (1.524 m)   Wt 138 lb (62.6 kg)   SpO2 97%   BMI 26.95 kg/m   Assessment and Plan: 1. Postoperative hypothyroidism Will check labs and advise Do not fill Rx until labs return tomorrow - levothyroxine (SYNTHROID) 112 MCG tablet; Take 1 tablet (112 mcg total) by mouth daily.  Dispense: 90 tablet; Refill: 1 - TSH + free T4  2. Cellulitis of groin Local care with moist heat TAO and loose bandaid. - amoxicillin-clavulanate (AUGMENTIN) 875-125 MG tablet; Take 1 tablet by mouth 2 (two) times daily for 10 days.  Dispense: 20 tablet; Refill: 0   Partially dictated using Editor, commissioning. Any errors are unintentional.  Halina Maidens, MD [facility]  10/30/2019

## 2019-10-31 ENCOUNTER — Telehealth: Payer: Self-pay | Admitting: Internal Medicine

## 2019-10-31 ENCOUNTER — Telehealth: Payer: Self-pay

## 2019-10-31 LAB — TSH+FREE T4
Free T4: 1.37 ng/dL (ref 0.82–1.77)
TSH: 0.606 u[IU]/mL (ref 0.450–4.500)

## 2019-10-31 NOTE — Telephone Encounter (Signed)
I returned pt call concerning her lab results. I told her that her thyroid was good to continue the same dosage. She explained to me that she went 10/30/2019 to pick up another medication and her thyroid medicine was ready for pick up as well at that time but she "cancelled the order" because she didn't have her lab results yet. She wanted to know if her medication would still be ready for her to pick up or if she needed a new prescription. I told her that she would just need to call the pharmacy and let them know that she is now ready to pick up her medication. Also told her if she had any problems getting her medicine to give me a call back. Pt verbalized understanding.  KP

## 2019-10-31 NOTE — Telephone Encounter (Signed)
Copied from [place] [id number]. Topic: General - Inquiry >> Oct 31, 2019  2:47 PM Rachael Miller, [place] wrote: Reason for CRM: Patient called in stating that she would like to discuss lab work as well as if she should continue same dosage of medication. Please advise.

## 2019-10-31 NOTE — Telephone Encounter (Signed)
Kie talked to her about labs

## 2020-03-17 ENCOUNTER — Other Ambulatory Visit: Payer: Self-pay

## 2020-03-17 ENCOUNTER — Telehealth: Payer: Self-pay

## 2020-03-17 DIAGNOSIS — Z20822 Contact with and (suspected) exposure to covid-19: Secondary | ICD-10-CM

## 2020-03-17 NOTE — Telephone Encounter (Signed)
Py called back saying she is going to the drive thur at [facility] in [place] today  CB#  [phone]

## 2020-03-17 NOTE — Telephone Encounter (Signed)
Called pt told her to go to [facility] to get a Covid test. Pt said she will call [facility] to see if they have any test. Pt said that one time before she had tried to call and get tested and [facility] said they didn't have any test. Pt is going to call and see if she can be seen and tested and if not pt will call the office to get a VV. And tested at the office for Covid.  KP

## 2020-03-17 NOTE — Telephone Encounter (Signed)
Copied from [place] [id number]. Topic: Quick Communication - See Telephone Encounter >> Mar 16, 2020  4:44 PM [place] wrote: CRM for notification. See Telephone encounter for: 03/16/20. CB [phone] Please FU PT states very sick, terrible constant coughing, congestion chest,  headache, feels absolutely awful, symptomatic, please call for an appt

## 2020-03-17 NOTE — Telephone Encounter (Signed)
Noted  KP 

## 2020-03-18 ENCOUNTER — Encounter: Payer: Self-pay | Admitting: Internal Medicine

## 2020-03-18 ENCOUNTER — Ambulatory Visit (INDEPENDENT_AMBULATORY_CARE_PROVIDER_SITE_OTHER): Payer: Self-pay | Admitting: Internal Medicine

## 2020-03-18 ENCOUNTER — Telehealth: Payer: Self-pay

## 2020-03-18 VITALS — Temp 98.7°F | Ht 60.0 in | Wt 138.0 lb

## 2020-03-18 DIAGNOSIS — J45901 Unspecified asthma with (acute) exacerbation: Secondary | ICD-10-CM

## 2020-03-18 DIAGNOSIS — Z20822 Contact with and (suspected) exposure to covid-19: Secondary | ICD-10-CM

## 2020-03-18 LAB — NOVEL CORONAVIRUS, NAA: SARS-CoV-2, NAA: NOT DETECTED

## 2020-03-18 LAB — SARS-COV-2, NAA 2 DAY TAT

## 2020-03-18 MED ORDER — HYDROCODONE-HOMATROPINE 5-1.5 MG/5ML PO SYRP: 5.0000 mL | ORAL_SOLUTION | Freq: Four times a day (QID) | ORAL | 0 refills | Status: AC | PRN

## 2020-03-18 MED ORDER — PREDNISONE 10 MG PO TABS: 10.0000 mg | ORAL_TABLET | ORAL | 0 refills | Status: AC

## 2020-03-18 MED ORDER — AMOXICILLIN-POT CLAVULANATE 875-125 MG PO TABS: 1.0000 | ORAL_TABLET | Freq: Two times a day (BID) | ORAL | 0 refills | Status: AC

## 2020-03-18 NOTE — Telephone Encounter (Signed)
This visit type is being conducted due to national recommendations for restrictions regarding the COVID- 19 Pandemic (e.g. social distancing) in effort to limit this patients exposure and mitigate transmission in our community. This visit type is felt to be most appropriate for this patient at this time.   I connected with the patient today and received telephone consent from the patient and patient understand this consent will be good for 1 year.  CM 

## 2020-03-18 NOTE — Progress Notes (Signed)
Date:  03/18/2020   Name:  Rachael Miller   DOB:  February 01, 1971   MRN:  [id number]   Chief Complaint: Cough (Started Saturday- thick clear mucous cough. Both ears are painful and her face is congested and painful. Cough is keeping her from sleeping - coughed for 6 hours in a row last night. Cannot bring anything out of chest. Ribs, and back hurts from coughing. )  Cough This is a new problem. The current episode started in the past 7 days. The problem has been gradually worsening. The problem occurs every few minutes. The cough is productive of sputum. Associated symptoms include ear congestion, ear pain, headaches, postnasal drip and wheezing. Pertinent negatives include no chest pain, chills, fever or shortness of breath. She has tried OTC cough suppressant for the symptoms.    Lab Results  Component Value Date   CREATININE 0.97 09/03/2019   BUN 13 09/03/2019   NA 138 09/03/2019   K 3.8 09/03/2019   CL 104 09/03/2019   CO2 23 09/03/2019   No results found for: CHOL, HDL, LDLCALC, LDLDIRECT, TRIG, CHOLHDL Lab Results  Component Value Date   TSH 0.606 10/30/2019   No results found for: HGBA1C Lab Results  Component Value Date   WBC 7.5 09/03/2019   HGB 14.6 09/03/2019   HCT 42.8 09/03/2019   MCV 94.9 09/03/2019   PLT 281 09/03/2019   No results found for: ALT, AST, GGT, ALKPHOS, BILITOT   Review of Systems  Constitutional: Positive for diaphoresis and fatigue. Negative for chills and fever.  HENT: Positive for ear pain and postnasal drip. Negative for trouble swallowing.   Respiratory: Positive for cough, chest tightness and wheezing. Negative for shortness of breath.   Cardiovascular: Negative for chest pain and palpitations.  Neurological: Positive for headaches. Negative for dizziness and light-headedness.  Psychiatric/Behavioral: Positive for sleep disturbance.    Patient Active Problem List   Diagnosis Date Noted  . Low back pain with sciatica 03/23/2018    . Hx of ovarian cancer 04/12/2016  . Tobacco use disorder 04/12/2016  . 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 Asthma in adult, mild intermittent, uncomplicated 03/22/5101    Allergies  Allergen Reactions  . Sulfamethoxazole-Trimethoprim Shortness Of Breath  . Flagyl [Metronidazole] Hives    Past Surgical History:  Procedure Laterality Date  . CESAREAN SECTION    . SHOULDER SURGERY Right   . THYROIDECTOMY  2007   goiter  . TOTAL VAGINAL HYSTERECTOMY  2000   ovarian cancer    Social History   Tobacco Use  . Smoking status: Current Every Day Smoker    Packs/day: 0.25    Years: 30.00    Pack years: 7.50    Last attempt to quit: 10/24/2018    Years since quitting: 1.4  . Smokeless tobacco: Never Used  Substance Use Topics  . Alcohol use: Yes    Comment: occ.  . Drug use: No     Medication list has been reviewed and updated.  Current Meds  Medication Sig  . levothyroxine (SYNTHROID) 112 MCG tablet Take 1 tablet (112 mcg total) by mouth daily.    PHQ 2/9 Scores 03/18/2020 10/30/2019 04/24/2019 01/28/2019  PHQ - 2 Score 0 0 0 0  PHQ- 9 Score 0 0 6 -    GAD 7 : Generalized Anxiety Score 03/18/2020 10/30/2019  Nervous, Anxious, on Edge 0 0  Control/stop worrying 0 0  Worry too much - different things 0 0  Trouble relaxing 0 0  Restless 0 0  Easily annoyed or irritable 0 0  Afraid - awful might happen 0 0  Total GAD 7 Score 0 0  Anxiety Difficulty Not difficult at all Not difficult at all    BP Readings from Last 3 Encounters:  10/30/19 102/66  09/03/19 134/88  01/28/19 112/76    Physical Exam Constitutional:      Appearance: She is ill-appearing.  Pulmonary:     Effort: No accessory muscle usage.     Comments: Frequent loose cough during the call.  Able to speak in full sentences. No obvious wheezing.  Neurological:     Mental Status: She is alert.  Psychiatric:        Attention and Perception: Attention normal.        Mood and Affect: Mood  normal.     Wt Readings from Last 3 Encounters:  03/18/20 138 lb (62.6 kg)  10/30/19 138 lb (62.6 kg)  09/03/19 142 lb (64.4 kg)    Temp 98.7 F (37.1 C) (Oral)   Ht 5' (1.524 m)   Wt 138 lb (62.6 kg)   BMI 26.95 kg/m   Assessment and Plan: 1. Encounter by telehealth for suspected COVID-19 Tested last [facility] at [facility] - results pending  2. Moderate asthma with acute exacerbation, unspecified whether persistent Sample of Breztri to pick up tomorrow 1-2 puffs bid Note to be out of work from 03/16/20 to 03/22/20 - predniSONE (DELTASONE) 10 MG tablet; Take 1 tablet (10 mg total) by mouth as directed for 6 days. Take 6,5,4,3,2,1 then stop  Dispense: 21 tablet; Refill: 0 - amoxicillin-clavulanate (AUGMENTIN) 875-125 MG tablet; Take 1 tablet by mouth 2 (two) times daily for 10 days.  Dispense: 20 tablet; Refill: 0 - HYDROcodone-homatropine (HYCODAN) 5-1.5 MG/5ML syrup; Take 5 mLs by mouth every 6 (six) hours as needed for up to 10 days for cough.  Dispense: 120 mL; Refill: 0  I spent 15 minutes on this encounter. Partially dictated using Editor, commissioning. Any errors are unintentional.  Halina Maidens, MD [facility]  03/18/2020

## 2020-05-01 ENCOUNTER — Other Ambulatory Visit: Payer: Self-pay | Admitting: Internal Medicine

## 2020-05-01 DIAGNOSIS — E890 Postprocedural hypothyroidism: Secondary | ICD-10-CM

## 2020-11-13 ENCOUNTER — Other Ambulatory Visit: Payer: Self-pay | Admitting: Internal Medicine

## 2020-11-13 DIAGNOSIS — E890 Postprocedural hypothyroidism: Secondary | ICD-10-CM

## 2020-11-16 ENCOUNTER — Telehealth: Payer: Self-pay

## 2020-11-16 NOTE — Telephone Encounter (Signed)
Called pt let her know that she could take over the counter cough medication. Also she could alternate ibuprofen and tylenol, drink plenty of water and rest. Told pt to call back if symptoms get worse. Pt verbalized understanding.  KP

## 2020-11-16 NOTE — Telephone Encounter (Unsigned)
Copied from [place] [id number]. Topic: General - Other >> Nov 16, 2020  9:46 AM Leward Quan A wrote: Reason for CRM: Patient called in to inform dr Army Melia that she tested positive for Covid asking what she can do. Symptoms include deep cough, stuffy head feeling tired and lethergic would like to know ifthere are any medications to combat the symptoms. Please call Ph# [phone]

## 2020-12-13 ENCOUNTER — Encounter: Payer: Self-pay | Admitting: *Deleted

## 2020-12-13 ENCOUNTER — Other Ambulatory Visit: Payer: Self-pay | Admitting: Internal Medicine

## 2020-12-13 DIAGNOSIS — M67831 Other specified disorders of synovium, right wrist: Secondary | ICD-10-CM | POA: Diagnosis not present

## 2020-12-13 DIAGNOSIS — E890 Postprocedural hypothyroidism: Secondary | ICD-10-CM

## 2020-12-13 DIAGNOSIS — M67839 Other specified disorders of synovium and tendon, unspecified forearm: Secondary | ICD-10-CM | POA: Diagnosis not present

## 2020-12-13 NOTE — Telephone Encounter (Signed)
Courtesy refill. No future appt . Send message via My Chart to call in to scheduled future appt. TSH labs needed.

## 2021-01-14 ENCOUNTER — Other Ambulatory Visit: Payer: Self-pay | Admitting: Internal Medicine

## 2021-01-14 DIAGNOSIS — E890 Postprocedural hypothyroidism: Secondary | ICD-10-CM

## 2021-01-14 NOTE — Telephone Encounter (Signed)
Requested medications are due for refill today yes  Requested medications are on the active medication list yes  Last refill 7/10  Last visit 10/2019  Future visit scheduled no  Notes to clinic Has already had a curtesy refill and there is no upcoming appointment scheduled.

## 2021-02-19 ENCOUNTER — Telehealth: Payer: Self-pay | Admitting: Internal Medicine

## 2021-02-19 DIAGNOSIS — E890 Postprocedural hypothyroidism: Secondary | ICD-10-CM

## 2021-02-20 NOTE — Telephone Encounter (Signed)
Requested medication (s) are due for refill today: yes  Requested medication (s) are on the active medication list: yes  Last refill:  01/15/21   Future visit scheduled: no  Notes to clinic:  Attempted to call pt, mailbox full"   Requested Prescriptions  Pending Prescriptions Disp Refills   levothyroxine (SYNTHROID) 112 MCG tablet [Pharmacy Med Name: LEVOTHYROXINE 0.'112MG'$  (112MCG) TABS] 30 tablet 0    Sig: TAKE 1 TABLET(112 MCG) BY MOUTH DAILY     Endocrinology:  Hypothyroid Agents Failed - 02/19/2021  5:06 PM      Failed - TSH needs to be rechecked within 3 months after an abnormal result. Refill until TSH is due.      Failed - TSH in normal range and within 360 days    TSH  Date Value Ref Range Status  10/30/2019 0.606 0.450 - 4.500 uIU/mL Final          Failed - Valid encounter within last 12 months    Recent Outpatient Visits           11 months ago Encounter by telehealth for suspected [facility] Glean Hess, MD   1 year ago Postoperative hypothyroidism   [facility] Glean Hess, MD   1 year ago Acute recurrent maxillary sinusitis   [facility] Glean Hess, MD   2 years ago Left hamstring injury, initial encounter   [facility] Glean Hess, MD   2 years ago Bronchitis   [facility] Glean Hess, MD

## 2021-02-22 ENCOUNTER — Other Ambulatory Visit: Payer: Self-pay

## 2021-02-22 DIAGNOSIS — E890 Postprocedural hypothyroidism: Secondary | ICD-10-CM

## 2021-02-22 MED ORDER — LEVOTHYROXINE SODIUM 112 MCG PO TABS: ORAL_TABLET | ORAL | 0 refills | Status: DC

## 2021-02-22 NOTE — Telephone Encounter (Signed)
Patient scheduled for Monday 03/01/21 say that she is completely out of her medication please advise

## 2021-02-22 NOTE — Telephone Encounter (Signed)
Informed pt that enough medication was sent in to last until appt. Pt verbalize understanding.  KP

## 2021-03-01 ENCOUNTER — Other Ambulatory Visit: Payer: Self-pay

## 2021-03-01 ENCOUNTER — Ambulatory Visit: Payer: BC Managed Care – PPO | Admitting: Internal Medicine

## 2021-03-01 ENCOUNTER — Encounter: Payer: Self-pay | Admitting: Internal Medicine

## 2021-03-01 VITALS — BP 104/70 | HR 72 | Ht 60.0 in | Wt 146.0 lb

## 2021-03-01 DIAGNOSIS — E890 Postprocedural hypothyroidism: Secondary | ICD-10-CM

## 2021-03-01 DIAGNOSIS — J4520 Mild intermittent asthma, uncomplicated: Secondary | ICD-10-CM | POA: Diagnosis not present

## 2021-03-01 DIAGNOSIS — M5442 Lumbago with sciatica, left side: Secondary | ICD-10-CM | POA: Diagnosis not present

## 2021-03-01 DIAGNOSIS — K219 Gastro-esophageal reflux disease without esophagitis: Secondary | ICD-10-CM | POA: Insufficient documentation

## 2021-03-01 DIAGNOSIS — J0101 Acute recurrent maxillary sinusitis: Secondary | ICD-10-CM

## 2021-03-01 MED ORDER — PREDNISONE 10 MG PO TABS: ORAL_TABLET | ORAL | 0 refills | Status: AC

## 2021-03-01 MED ORDER — AMOXICILLIN-POT CLAVULANATE 875-125 MG PO TABS: 1.0000 | ORAL_TABLET | Freq: Two times a day (BID) | ORAL | 0 refills | Status: AC

## 2021-03-01 MED ORDER — OMEPRAZOLE 40 MG PO CPDR: 40.0000 mg | DELAYED_RELEASE_CAPSULE | Freq: Two times a day (BID) | ORAL | 1 refills | Status: DC

## 2021-03-01 MED ORDER — GABAPENTIN (ONCE-DAILY) 300 MG PO TABS: 300.0000 mg | ORAL_TABLET | Freq: Every evening | ORAL | 0 refills | Status: DC | PRN

## 2021-03-01 NOTE — Progress Notes (Signed)
Date:  03/01/2021   Name:  Rachael Miller   DOB:  1971-05-09   MRN:  [id number]   Chief Complaint: Hypothyroidism (C/O of being fatigued all the time. Sleeping 8 hrs at night and still exhausted. ), Sciatica, Gastroesophageal Reflux, and Sinusitis  Thyroid Problem Presents for follow-up visit. Symptoms include fatigue. Patient reports no anxiety, constipation or hair loss. The symptoms have been stable.  Leg Pain  The incident occurred more than 1 week ago. There was no injury mechanism. The pain is present in the left leg. The quality of the pain is described as aching, burning and shooting. The pain is at a severity of 8/10. The pain is moderate. The pain has been Constant since onset. Associated symptoms include numbness and tingling.  Gastroesophageal Reflux She complains of belching, chest pain and heartburn. This is a chronic problem. The current episode started more than 1 year ago. The problem occurs constantly. The heartburn is located in the substernum and left chest. The heartburn is of moderate intensity. The heartburn wakes her from sleep. The heartburn limits her activity. The heartburn changes with position. Associated symptoms include fatigue. She has tried a PPI for the symptoms. The treatment provided mild relief.  Sinus Problem This is a new problem. The current episode started yesterday. The problem has been waxing and waning since onset. There has been no fever. Associated symptoms include sinus pressure.   Clinical Intake:  Pre-visit preparation completed: Yes  Pain : No/denies pain     BMI - recorded: 26.95 Nutritional Status: BMI 25 -29 Overweight Nutritional Risks: None Diabetes: No  Activities of Daily Living: Independent Ambulation: Independent Medication Administration: Independent Home Management: Independent     Do you feel unsafe in your current relationship?: No Do you feel physically threatened by others?: No Anyone hurting you at  home, work, or school?: No  How often do you need to have someone help you when you read instructions, pamphlets, or other written materials from your doctor or pharmacy?: 1 - Never  Interpreter Needed?: No  Information entered by :: Wyatt Haste, CMA   Lab Results  Component Value Date   CREATININE 0.97 09/03/2019   BUN 13 09/03/2019   NA 138 09/03/2019   K 3.8 09/03/2019   CL 104 09/03/2019   CO2 23 09/03/2019   No results found for: CHOL, HDL, LDLCALC, LDLDIRECT, TRIG, CHOLHDL Lab Results  Component Value Date   TSH 0.606 10/30/2019   No results found for: HGBA1C Lab Results  Component Value Date   WBC 7.5 09/03/2019   HGB 14.6 09/03/2019   HCT 42.8 09/03/2019   MCV 94.9 09/03/2019   PLT 281 09/03/2019   No results found for: ALT, AST, GGT, ALKPHOS, BILITOT   Review of Systems  Constitutional:  Positive for fatigue.  HENT:  Positive for sinus pressure.   Cardiovascular:  Positive for chest pain.  Gastrointestinal:  Positive for heartburn. Negative for constipation.  Neurological:  Positive for tingling and numbness.  Psychiatric/Behavioral:  The patient is not nervous/anxious.    Patient Active Problem List   Diagnosis Date Noted   Low back pain with sciatica 03/23/2018   Hx of ovarian cancer 04/12/2016   Tobacco use disorder 04/12/2016   Postoperative hypothyroidism 04/12/2016   Asthma in adult, mild intermittent, uncomplicated 50/53/9767    Allergies  Allergen Reactions   Sulfamethoxazole-Trimethoprim Shortness Of Breath   Flagyl [Metronidazole] Hives    Past Surgical History:  Procedure Laterality Date   CESAREAN  SECTION     SHOULDER SURGERY Right    THYROIDECTOMY  2007   goiter   TOTAL VAGINAL HYSTERECTOMY  2000   ovarian cancer    Social History   Tobacco Use   Smoking status: Every Day    Packs/day: 0.25    Years: 30.00    Pack years: 7.50    Types: Cigarettes    Last attempt to quit: 10/24/2018    Years since quitting: 2.3    Smokeless tobacco: Never  Substance Use Topics   Alcohol use: Yes    Comment: occ.   Drug use: No     Medication list has been reviewed and updated.  Current Meds  Medication Sig   famotidine (PEPCID) 20 MG tablet Take 20 mg by mouth 2 (two) times daily.   levothyroxine (SYNTHROID) 112 MCG tablet TAKE 1 TABLET(112 MCG) BY MOUTH DAILY    PHQ 2/9 Scores 03/01/2021 03/18/2020 10/30/2019 04/24/2019  PHQ - 2 Score 0 0 0 0  PHQ- 9 Score 3 0 0 6    GAD 7 : Generalized Anxiety Score 03/01/2021 03/18/2020 10/30/2019  Nervous, Anxious, on Edge 0 0 0  Control/stop worrying 0 0 0  Worry too much - different things 0 0 0  Trouble relaxing 0 0 0  Restless 0 0 0  Easily annoyed or irritable 0 0 0  Afraid - awful might happen 0 0 0  Total GAD 7 Score 0 0 0  Anxiety Difficulty Not difficult at all Not difficult at all Not difficult at all    BP Readings from Last 3 Encounters:  03/01/21 104/70  10/30/19 102/66  09/03/19 134/88    Physical Exam Constitutional:      Appearance: Normal appearance.  HENT:     Right Ear: Tympanic membrane is retracted. Tympanic membrane is not erythematous.     Left Ear: Tympanic membrane is retracted. Tympanic membrane is not erythematous.     Nose:     Right Sinus: Maxillary sinus tenderness present. No frontal sinus tenderness.     Left Sinus: Maxillary sinus tenderness present. No frontal sinus tenderness.  Neck:     Thyroid: No thyroid mass, thyromegaly or thyroid tenderness.  Cardiovascular:     Rate and Rhythm: Normal rate and regular rhythm.     Pulses: Normal pulses.     Heart sounds: No murmur heard. Pulmonary:     Effort: Pulmonary effort is normal.     Breath sounds: Normal breath sounds. No decreased breath sounds or wheezing.  Abdominal:     General: Abdomen is protuberant. Bowel sounds are normal.     Palpations: Abdomen is soft.     Tenderness: There is abdominal tenderness in the left upper quadrant. There is no right CVA  tenderness, left CVA tenderness, guarding or rebound.  Musculoskeletal:     Cervical back: Normal range of motion.     Lumbar back: No deformity or lacerations. Normal range of motion. Positive left straight leg raise test. Negative right straight leg raise test.  Lymphadenopathy:     Cervical: No cervical adenopathy.  Neurological:     Mental Status: She is alert.     Sensory: Sensation is intact.     Motor: Motor function is intact.     Deep Tendon Reflexes:     Reflex Scores:      Patellar reflexes are 2+ on the right side and 2+ on the left side.      Achilles reflexes are 2+ on the right  side and 2+ on the left side.   Wt Readings from Last 3 Encounters:  03/01/21 146 lb (66.2 kg)  03/18/20 138 lb (62.6 kg)  10/30/19 138 lb (62.6 kg)    BP 104/70   Pulse 72   Ht 5' (1.524 m)   Wt 146 lb (66.2 kg)   SpO2 96%   BMI 28.51 kg/m   Assessment and Plan: 1. Postoperative hypothyroidism Supplemented; check labs and advise - TSH + free T4 - Comprehensive metabolic panel  2. Asthma in adult, mild intermittent, uncomplicated Stable without current flare  3. Gastroesophageal reflux disease, unspecified whether esophagitis present New persistent GERD sx in the setting of regular doses of Advil for back pain Stop all NSAIDS; rule H Pylori, check labs High dose PPI for one month then reassess - CBC with Differential/Platelet - H. pylori breath test - omeprazole (PRILOSEC) 40 MG capsule; Take 1 capsule (40 mg total) by mouth 2 (two) times daily.  Dispense: 60 capsule; Refill: 1  4. Left-sided low back pain with left-sided sciatica, unspecified chronicity Take tylenol only; stop all NSAIDS Trial of steroids and gabapentin - Gabapentin, Once-Daily, 300 MG TABS; Take 300 mg by mouth at bedtime as needed.  Dispense: 30 tablet; Refill: 0 - predniSONE (DELTASONE) 10 MG tablet; Take 6 on day 1and 2, 5 on day 3 and 4, 4 on day 5 and 6 , 3 on day 7 and 8, 2 on day 9 and 10 and 1 on day  11 and 12 then stop.  Dispense: 42 tablet; Refill: 0  5. Acute recurrent maxillary sinusitis Resume Flonase spray - amoxicillin-clavulanate (AUGMENTIN) 875-125 MG tablet; Take 1 tablet by mouth 2 (two) times daily for 10 days.  Dispense: 20 tablet; Refill: 0   Partially dictated using Editor, commissioning. Any errors are unintentional.  Halina Maidens, MD [facility]  03/01/2021

## 2021-03-02 ENCOUNTER — Other Ambulatory Visit: Payer: Self-pay | Admitting: Internal Medicine

## 2021-03-02 DIAGNOSIS — E890 Postprocedural hypothyroidism: Secondary | ICD-10-CM

## 2021-03-02 LAB — CBC WITH DIFFERENTIAL/PLATELET
Basophils Absolute: 0.1 10*3/uL (ref 0.0–0.2)
Basos: 1 %
EOS (ABSOLUTE): 0.2 10*3/uL (ref 0.0–0.4)
Eos: 3 %
Hematocrit: 44.7 % (ref 34.0–46.6)
Hemoglobin: 15 g/dL (ref 11.1–15.9)
Immature Grans (Abs): 0 10*3/uL (ref 0.0–0.1)
Immature Granulocytes: 0 %
Lymphocytes Absolute: 3 10*3/uL (ref 0.7–3.1)
Lymphs: 37 %
MCH: 31.9 pg (ref 26.6–33.0)
MCHC: 33.6 g/dL (ref 31.5–35.7)
MCV: 95 fL (ref 79–97)
Monocytes Absolute: 0.5 10*3/uL (ref 0.1–0.9)
Monocytes: 6 %
Neutrophils Absolute: 4.1 10*3/uL (ref 1.4–7.0)
Neutrophils: 53 %
Platelets: 266 10*3/uL (ref 150–450)
RBC: 4.7 x10E6/uL (ref 3.77–5.28)
RDW: 12.8 % (ref 11.7–15.4)
WBC: 7.9 10*3/uL (ref 3.4–10.8)

## 2021-03-02 LAB — COMPREHENSIVE METABOLIC PANEL
ALT: 25 IU/L (ref 0–32)
AST: 16 IU/L (ref 0–40)
Albumin/Globulin Ratio: 2.2 (ref 1.2–2.2)
Albumin: 4.8 g/dL (ref 3.8–4.8)
Alkaline Phosphatase: 76 IU/L (ref 44–121)
BUN/Creatinine Ratio: 16 (ref 9–23)
BUN: 15 mg/dL (ref 6–24)
Bilirubin Total: <0.2 mg/dL (ref 0.0–1.2)
CO2: 27 mmol/L (ref 20–29)
Calcium: 9.5 mg/dL (ref 8.7–10.2)
Chloride: 102 mmol/L (ref 96–106)
Creatinine, Ser: 0.94 mg/dL (ref 0.57–1.00)
Globulin, Total: 2.2 g/dL (ref 1.5–4.5)
Glucose: 100 mg/dL — ABNORMAL HIGH (ref 70–99)
Potassium: 4.7 mmol/L (ref 3.5–5.2)
Sodium: 141 mmol/L (ref 134–144)
Total Protein: 7 g/dL (ref 6.0–8.5)
eGFR: 74 mL/min/{1.73_m2} (ref >59)

## 2021-03-02 LAB — TSH+FREE T4
Free T4: 1.7 ng/dL (ref 0.82–1.77)
TSH: 1.43 u[IU]/mL (ref 0.450–4.500)

## 2021-03-02 MED ORDER — LEVOTHYROXINE SODIUM 112 MCG PO TABS: ORAL_TABLET | ORAL | 1 refills | Status: DC

## 2021-04-17 ENCOUNTER — Ambulatory Visit
Admission: EM | Admit: 2021-04-17 | Discharge: 2021-04-17 | Disposition: A | Discharge status: Home / Self Care | Payer: BC Managed Care – PPO | Attending: Emergency Medicine | Admitting: Emergency Medicine

## 2021-04-17 ENCOUNTER — Encounter: Payer: Self-pay | Admitting: Emergency Medicine

## 2021-04-17 DIAGNOSIS — J22 Unspecified acute lower respiratory infection: Secondary | ICD-10-CM | POA: Diagnosis not present

## 2021-04-17 MED ORDER — ALBUTEROL SULFATE HFA 108 (90 BASE) MCG/ACT IN AERS
8.0000 | INHALATION_SPRAY | Freq: Once | RESPIRATORY_TRACT | Status: AC
  Administered 2021-04-17: 8 via RESPIRATORY_TRACT

## 2021-04-17 MED ORDER — PREDNISONE 10 MG PO TABS: ORAL_TABLET | ORAL | 0 refills | Status: AC

## 2021-04-17 MED ORDER — DOXYCYCLINE HYCLATE 100 MG PO CAPS: 100.0000 mg | ORAL_CAPSULE | Freq: Two times a day (BID) | ORAL | 0 refills | Status: DC

## 2021-04-17 NOTE — ED Triage Notes (Signed)
Pt presents with cough, ST, bilateral ears feel clogged, and sinus pressure x 3-4 days

## 2021-04-17 NOTE — Discharge Instructions (Addendum)
Take doxycycline and prednisone as prescribed  You may use your albuterol inhaler every 6 hours of 1 to 2 puffs as needed  Rest, push lots of fluids (especially water), and utilize supportive care for symptoms. You may take take acetaminophen (Tylenol) every 4-6 hours or ibuprofen every 6-8 hours for muscle pain, joint pain, headaches. Mucinex (guaifenesin) may be taken over the counter for cough as needed and can loosen phlegm. Please read the instructions and take as directed. Saline nasal sprays to rinse congestion can help as well. Warm tea with lemon and honey can sooth sore throat and cough, as can cough drops.  Return to clinic for new-onset fever, difficulty breathing, chest pain, symptoms lasting >3 to 4 weeks, or bloody sputum.

## 2021-04-17 NOTE — ED Provider Notes (Signed)
CHIEF COMPLAINT:   Chief Complaint  Patient presents with   Cough   Sore Throat     SUBJECTIVE/HPI:   Cough Sore Throat  A very pleasant 50 y.o.Female presents today with cough, sore throat, bilateral ear fullness, sinus pressure for the last 3 to 4 days. Patient does not report any shortness of breath, chest pain, palpitations, visual changes, weakness, tingling, headache, nausea, vomiting, diarrhea, fever, chills.   has a past medical history of Chronic bronchitis ([place]), History of thyroidectomy, Ovarian cancer ([place]), and Pneumonia.  ROS:  Review of Systems  Respiratory:  Positive for cough.   See Subjective/HPI Medications, Allergies and Problem List personally reviewed in Epic today OBJECTIVE:   Vitals:   04/17/21 1532  BP: 130/83  Pulse: 88  Temp: 98.1 F (36.7 C)  SpO2: 95%    Physical Exam   General: Appears well-developed and well-nourished. No acute distress.  HEENT Head: Normocephalic and atraumatic.   Ears: Hearing grossly intact, no drainage or visible deformity.  Nose: No nasal deviation.   Mouth/Throat: No stridor or tracheal deviation.  Non erythematous posterior pharynx noted with clear drainage present.  No white patchy exudate noted. Eyes: Conjunctivae and EOM are normal. No eye drainage or scleral icterus bilaterally.  Neck: Normal range of motion, neck is supple. + bilateral anterior cervical lymph nodes palpated.  Tender, mobile, soft. Cardiovascular: Normal rate. Regular rhythm; no murmurs, gallops, or rubs.  Pulm/Chest: No respiratory distress.  Inspiratory and expiratory wheezing noted bilaterally over upper and lower lung fields.  Diffuse rhonchi noted to bilateral lower lobes. Neurological: Alert and oriented to person, place, and time.  Skin: Skin is warm and dry.  No rashes, lesions, abrasions or bruising noted to skin.   Psychiatric: Normal mood, affect, behavior, and thought content.   Vital signs and nursing note reviewed.   Patient  stable and cooperative with examination. PROCEDURES:    LABS/X-RAYS/EKG/MEDS:   No results found for any visits on 04/17/21.  MEDICAL DECISION MAKING:   Patient presents with cough, sore throat, bilateral ear fullness, sinus pressure for the last 3 to 4 days. Patient does not report any shortness of breath, chest pain, palpitations, visual changes, weakness, tingling, headache, nausea, vomiting, diarrhea, fever, chills.  Albuterol inhaler treatment given in clinic today of 8 puffs.  Given symptoms along with assessment findings, likely lower respiratory tract infection.  Rx prednisone and doxycycline to the patient's preferred pharmacy. Advised rest, fluids, Tylenol, ibuprofen.  Follow-up with PCP if not improving over the next 5 to 7 days.  Return for any new high fever not improved with medications, chest pain, difficulty breathing, nonstop vomiting or coughing up blood.   Patient verbalized understanding and agreed with treatment plan.  Patient stable upon discharge. ASSESSMENT/PLAN:  1. Lower respiratory tract infection - albuterol (VENTOLIN HFA) 108 (90 Base) MCG/ACT inhaler 8 puff - doxycycline (VIBRAMYCIN) 100 MG capsule; Take 1 capsule (100 mg total) by mouth 2 (two) times daily.  Dispense: 20 capsule; Refill: 0 - predniSONE (DELTASONE) 10 MG tablet; Take 6 tablets (60 mg total) by mouth daily for 1 day, THEN 5 tablets (50 mg total) daily for 1 day, THEN 4 tablets (40 mg total) daily for 1 day, THEN 3 tablets (30 mg total) daily for 1 day, THEN 2 tablets (20 mg total) daily for 1 day, THEN 1 tablet (10 mg total) daily for 1 day.  Dispense: 21 tablet; Refill: 0 Instructions about new medications and side effects provided.  Plan:   Discharge  Instructions      Take doxycycline and prednisone as prescribed  You may use your albuterol inhaler every 6 hours of 1 to 2 puffs as needed  Rest, push lots of fluids (especially water), and utilize supportive care for symptoms. You may take  take acetaminophen (Tylenol) every 4-6 hours or ibuprofen every 6-8 hours for muscle pain, joint pain, headaches. Mucinex (guaifenesin) may be taken over the counter for cough as needed and can loosen phlegm. Please read the instructions and take as directed. Saline nasal sprays to rinse congestion can help as well. Warm tea with lemon and honey can sooth sore throat and cough, as can cough drops.  Return to clinic for new-onset fever, difficulty breathing, chest pain, symptoms lasting >3 to 4 weeks, or bloody sputum.          Serafina Royals, [place] 04/17/21 1615

## 2021-05-29 ENCOUNTER — Ambulatory Visit
Admission: EM | Admit: 2021-05-29 | Discharge: 2021-05-29 | Disposition: A | Discharge status: Home / Self Care | Payer: BC Managed Care – PPO | Attending: Physician Assistant | Admitting: Physician Assistant

## 2021-05-29 ENCOUNTER — Other Ambulatory Visit: Payer: Self-pay

## 2021-05-29 ENCOUNTER — Ambulatory Visit (INDEPENDENT_AMBULATORY_CARE_PROVIDER_SITE_OTHER): Payer: BC Managed Care – PPO

## 2021-05-29 DIAGNOSIS — R051 Acute cough: Secondary | ICD-10-CM | POA: Diagnosis not present

## 2021-05-29 DIAGNOSIS — J209 Acute bronchitis, unspecified: Secondary | ICD-10-CM

## 2021-05-29 DIAGNOSIS — J189 Pneumonia, unspecified organism: Secondary | ICD-10-CM

## 2021-05-29 MED ORDER — PREDNISONE 20 MG PO TABS: 40.0000 mg | ORAL_TABLET | Freq: Every day | ORAL | 0 refills | Status: AC

## 2021-05-29 MED ORDER — CHERATUSSIN AC 100-10 MG/5ML PO SOLN: 10.0000 mL | Freq: Three times a day (TID) | ORAL | 0 refills | Status: DC | PRN

## 2021-05-29 NOTE — ED Provider Notes (Signed)
[facility]    CSN: [id number] Arrival date & time: 05/29/21  1016      History   Chief Complaint Chief Complaint  Patient presents with   Cough   Chest Pain    HPI [place] is a 50 y.o. female presenting for 3-day history of productive cough, nasal congestion and sore throat.  Patient also reports bilateral rib pain when coughing.  Patient says that she was diagnosed with pneumonia 1-1/2 months ago and took prednisone and doxycycline.  Reports that she was fine for couple of weeks and then got sick again.  Think she may have pneumonia or bronchitis again.  Patient is a smoker but has not smoked in the past 1.5 weeks.  No history of COPD.  Does have a history of asthma in her medical record.  Patient has not had any recent fevers or breathing trouble.  No sick contacts.  Has taken a COVID test at home which has been negative.  No other complaints.  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      Home Medications    Prior to Admission medications   Medication Sig Start Date End Date Taking? Authorizing Provider  doxycycline (VIBRAMYCIN) 100 MG capsule Take 1 capsule (100 mg total) by mouth 2 (two) times daily. 04/17/21  Yes Boddu, Erasmo Downer, FNP   guaiFENesin-codeine (CHERATUSSIN AC) 100-10 MG/5ML syrup Take 10 mLs by mouth 3 (three) times daily as needed for cough. 05/29/21  Yes Danton Clap, PA-C  levothyroxine (SYNTHROID) 112 MCG tablet TAKE 1 TABLET(112 MCG) BY MOUTH DAILY 03/02/21  Yes Glean Hess, MD  predniSONE (DELTASONE) 20 MG tablet Take 2 tablets (40 mg total) by mouth daily for 5 days. 05/29/21 06/03/21 Yes Danton Clap, PA-C  famotidine (PEPCID) 20 MG tablet Take 20 mg by mouth 2 (two) times daily.    [provider]  Gabapentin, Once-Daily, 300 MG TABS Take 300 mg by mouth at bedtime as needed. 03/01/21   Glean Hess, MD  omeprazole (PRILOSEC) 40 MG capsule Take 1 capsule (40 mg total) by mouth 2 (two) times daily. 03/01/21   Glean Hess, MD    Family History Family History  Adopted: Yes    Social History Social History   Tobacco Use   Smoking status: Every Day    Packs/day: 0.25    Years: 30.00    Pack years: 7.50    Types: Cigarettes    Last attempt to quit: 10/24/2018    Years since quitting: 2.5   Smokeless tobacco: Never  Vaping Use   Vaping Use: Never used  Substance Use Topics   Alcohol use: Yes  Comment: occ.   Drug use: No     Allergies   Sulfamethoxazole-trimethoprim and Flagyl [metronidazole]   Review of Systems Review of Systems  Constitutional:  Positive for fatigue. Negative for chills, diaphoresis and fever.  HENT:  Positive for congestion, rhinorrhea and sore throat. Negative for ear pain, sinus pressure and sinus pain.   Respiratory:  Positive for cough. Negative for shortness of breath.   Cardiovascular:  Positive for chest pain.  Gastrointestinal:  Negative for abdominal pain, nausea and vomiting.  Musculoskeletal:  Negative for arthralgias and myalgias.  Skin:  Negative for rash.  Neurological:  Negative for weakness and headaches.  Hematological:  Negative for adenopathy.  Psychiatric/Behavioral:  Positive for sleep disturbance (due to cough).      Physical Exam Triage Vital Signs ED Triage Vitals  Enc Vitals Group     BP 05/29/21 1058 (!) 140/92     Pulse Rate 05/29/21 1058 81     Resp 05/29/21 1058 18     Temp 05/29/21 1058 98.3 F (36.8 C)     Temp Source 05/29/21 1058 Oral     SpO2 05/29/21 1058 100 %     Weight 05/29/21 1057 138 lb (62.6 kg)     Height 05/29/21 1057 5' (1.524 m)     Head Circumference --      Peak Flow --      Pain Score 05/29/21 1057 5     Pain Loc --      Pain Edu? --      Excl. in [place]? --    No data found.  Updated Vital Signs BP (!) 140/92 (BP Location: Left Arm)    Pulse 81    Temp 98.3 F (36.8 C) (Oral)    Resp 18    Ht 5' (1.524 m)    Wt 138 lb (62.6 kg)    SpO2 100%    BMI 26.95 kg/m      Physical Exam Vitals and nursing note reviewed.  Constitutional:      General: She is not in acute distress.    Appearance: Normal appearance. She is ill-appearing. She is not toxic-appearing.  HENT:     Head: Normocephalic and atraumatic.     Nose: Congestion present.     Mouth/Throat:     Mouth: Mucous membranes are moist.     Pharynx: Oropharynx is clear. Posterior oropharyngeal erythema present.  Eyes:     General: No scleral icterus.       Right eye: No discharge.        Left eye: No discharge.     Conjunctiva/sclera: Conjunctivae normal.  Cardiovascular:     Rate and Rhythm: Normal rate and regular rhythm.     Heart sounds: Normal heart sounds.  Pulmonary:     Effort: Pulmonary effort is normal. No respiratory distress.     Breath sounds: Normal breath sounds. No wheezing, rhonchi or rales.  Musculoskeletal:     Cervical back: Neck supple.  Skin:    General: Skin is dry.  Neurological:     General: No focal deficit present.     Mental Status: She is alert. Mental status is at baseline.     Motor: No weakness.     Gait: Gait normal.  Psychiatric:        Mood and Affect: Mood normal.        Behavior: Behavior normal.        Thought Content: Thought content normal.     UC  Treatments / Results  Labs (all labs ordered are listed, but only abnormal results are displayed) Labs Reviewed - No data to display  EKG   Radiology DG Chest 2 View  Result Date: 05/29/2021 CLINICAL DATA:  Pneumonia EXAM: CHEST - 2 VIEW COMPARISON:  None. FINDINGS: Normal mediastinum and cardiac silhouette. Normal pulmonary vasculature. No evidence of effusion, infiltrate, or pneumothorax. No acute bony abnormality. IMPRESSION: No acute cardiopulmonary process. Electronically Signed   By: Suzy Bouchard M.D.   On: 05/29/2021 11:18    Procedures Procedures (including critical care time)  Medications Ordered in UC Medications - No data to display  Initial Impression / Assessment and Plan / UC Course  I have reviewed the triage vital signs and the nursing notes.  Pertinent labs & imaging results that were available during my care of the patient were reviewed by me and considered in my medical decision making (see chart for details).    50 year old female presenting for 3-day history of productive cough and nasal congestion as well as fatigue.  Vitals all stable.  She is ill-appearing but nontoxic.  Nasal congestion on exam and mild posterior pharyngeal erythema.  Chest clear auscultation.  Chest x-ray obtained to assess for possible pneumonia and to make sure her previous pneumonia has cleared up.  X-ray reviewed by me.  No evidence of pneumonia.  Discussed this with patient.  Suspect bronchitis, likely viral.  Supportive care encouraged.  Sent prednisone to pharmacy as well as Cheratussin after reviewing controlled substance database and finding patient to be low risk for abuse.  Use inhaler as needed for any shortness of breath.  Increase rest and fluids.  Follow-up with PCP if not improving the next couple of weeks.  Reviewed return and ER precautions.   Final Clinical Impressions(s) / UC Diagnoses   Final diagnoses:  Acute bronchitis, unspecified organism  Acute cough      Discharge Instructions      -You have viral bronchitis. - Take another COVID test at home.  If is positive need to isolate 5 days from symptom onset and wear mask 5 days. - Increase rest and fluids.  I have sent cough medicine to the pharmacy for you.  It does contain codeine so be careful in taking it as it can make you drowsy. - Follow-up with PCP if not improving in the next couple of weeks. - Return if fever, worsening cough or trouble breathing.     ED Prescriptions     Medication Sig Dispense Auth. Provider   predniSONE (DELTASONE) 20 MG tablet Take 2 tablets (40 mg total) by mouth daily for 5 days. 10 tablet Laurene Footman B, PA-C   guaiFENesin-codeine (CHERATUSSIN AC) 100-10 MG/5ML syrup Take 10 mLs by mouth 3 (three) times daily as needed for cough. 118 mL Danton Clap, PA-C      I have reviewed the PDMP during this encounter.   Danton Clap, PA-C 05/29/21 1227

## 2021-05-29 NOTE — ED Triage Notes (Signed)
Pt had pneumonia a month and a half ago and states that she currently feels the same. Pt Cough, chest pain when coughing x3days ago. Pt states that symptoms started while at work around lunch time.   Pt states that she took a covid test and it was negative. Pt believes she has pneumonia or bronchitis.

## 2021-05-29 NOTE — Discharge Instructions (Signed)
-  You have viral bronchitis. - Take another COVID test at home.  If is positive need to isolate 5 days from symptom onset and wear mask 5 days. - Increase rest and fluids.  I have sent cough medicine to the pharmacy for you.  It does contain codeine so be careful in taking it as it can make you drowsy. - Follow-up with PCP if not improving in the next couple of weeks. - Return if fever, worsening cough or trouble breathing.

## 2021-06-09 ENCOUNTER — Other Ambulatory Visit: Payer: Self-pay

## 2021-06-09 ENCOUNTER — Ambulatory Visit
Admission: RE | Admit: 2021-06-09 | Discharge: 2021-06-09 | Disposition: A | Discharge status: Home / Self Care | Payer: BC Managed Care – PPO | Attending: Internal Medicine | Admitting: Internal Medicine

## 2021-06-09 ENCOUNTER — Encounter: Payer: Self-pay | Admitting: Internal Medicine

## 2021-06-09 ENCOUNTER — Ambulatory Visit: Payer: BC Managed Care – PPO | Admitting: Internal Medicine

## 2021-06-09 ENCOUNTER — Ambulatory Visit
Admission: RE | Admit: 2021-06-09 | Discharge: 2021-06-09 | Disposition: A | Discharge status: Home / Self Care | Payer: BC Managed Care – PPO | Source: Ambulatory Visit | Attending: Internal Medicine | Admitting: Internal Medicine

## 2021-06-09 VITALS — BP 124/74 | HR 68 | Temp 98.2°F | Ht 60.0 in | Wt 148.0 lb

## 2021-06-09 DIAGNOSIS — R059 Cough, unspecified: Secondary | ICD-10-CM | POA: Diagnosis not present

## 2021-06-09 DIAGNOSIS — J189 Pneumonia, unspecified organism: Secondary | ICD-10-CM | POA: Diagnosis not present

## 2021-06-09 MED ORDER — ALBUTEROL SULFATE HFA 108 (90 BASE) MCG/ACT IN AERS
2.0000 | INHALATION_SPRAY | Freq: Four times a day (QID) | RESPIRATORY_TRACT | 0 refills | Status: DC | PRN
Start: 2021-06-09 — End: 2023-12-07

## 2021-06-09 MED ORDER — LEVOFLOXACIN 500 MG PO TABS
500.0000 mg | ORAL_TABLET | Freq: Every day | ORAL | 0 refills | Status: AC
Start: 2021-06-09 — End: 2021-06-16

## 2021-06-09 MED ORDER — HYDROCODONE BIT-HOMATROP MBR 5-1.5 MG/5ML PO SOLN: 5.0000 mL | Freq: Four times a day (QID) | ORAL | 0 refills | Status: AC | PRN

## 2021-06-09 NOTE — Progress Notes (Signed)
Date:  06/09/2021   Name:  Rachael Miller   DOB:  22-Mar-1971   MRN:  [id number]   Chief Complaint: Cough (Chronic persistent cough, throat pain, heaviness in chest, Neg Covid & Neg Flu)  Cough This is a new problem. Episode onset: 1 month. The problem has been gradually worsening. The problem occurs every few minutes. The cough is Productive of sputum (yellow/green). Associated symptoms include chest pain, chills, ear pain, postnasal drip, a sore throat, shortness of breath and wheezing. Pertinent negatives include no fever. She has tried OTC cough suppressant for the symptoms. The treatment provided mild relief. Her past medical history is significant for asthma.   Lab Results  Component Value Date   NA 141 03/01/2021   K 4.7 03/01/2021   CO2 27 03/01/2021   GLUCOSE 100 (H) 03/01/2021   BUN 15 03/01/2021   CREATININE 0.94 03/01/2021   CALCIUM 9.5 03/01/2021   EGFR 74 03/01/2021   GFRNONAA >60 09/03/2019   No results found for: CHOL, HDL, LDLCALC, LDLDIRECT, TRIG, CHOLHDL Lab Results  Component Value Date   TSH 1.430 03/01/2021   No results found for: HGBA1C Lab Results  Component Value Date   WBC 7.9 03/01/2021   HGB 15.0 03/01/2021   HCT 44.7 03/01/2021   MCV 95 03/01/2021   PLT 266 03/01/2021   Lab Results  Component Value Date   ALT 25 03/01/2021   AST 16 03/01/2021   ALKPHOS 76 03/01/2021   BILITOT <0.2 03/01/2021   No results found for: 25OHVITD2, 25OHVITD3, VD25OH   Review of Systems  Constitutional:  Positive for chills. Negative for fever.  HENT:  Positive for ear pain, postnasal drip and sore throat.   Respiratory:  Positive for cough, chest tightness, shortness of breath and wheezing.   Cardiovascular:  Positive for chest pain.  Gastrointestinal:  Negative for diarrhea, nausea and vomiting.   Patient Active Problem List   Diagnosis Date Noted   Gastroesophageal reflux disease 03/01/2021   Low back pain with sciatica 03/23/2018   Hx of  ovarian cancer 04/12/2016   Tobacco use disorder 04/12/2016   Postoperative hypothyroidism 04/12/2016   Asthma in adult, mild intermittent, uncomplicated 76/19/5093    Allergies  Allergen Reactions   Sulfamethoxazole-Trimethoprim Shortness Of Breath   Flagyl [Metronidazole] Hives    Past Surgical History:  Procedure Laterality Date   CESAREAN SECTION     SHOULDER SURGERY Right    THYROIDECTOMY  2007   goiter   TOTAL VAGINAL HYSTERECTOMY  2000   ovarian cancer    Social History   Tobacco Use   Smoking status: Every Day    Packs/day: 0.25    Years: 30.00    Pack years: 7.50    Types: Cigarettes    Last attempt to quit: 10/24/2018    Years since quitting: 2.6   Smokeless tobacco: Never  Vaping Use   Vaping Use: Never used  Substance Use Topics   Alcohol use: Yes    Comment: occ.   Drug use: No     Medication list has been reviewed and updated.  Current Meds  Medication Sig   Acetaminophen (TYLENOL PO) Take by mouth.   levothyroxine (SYNTHROID) 112 MCG tablet TAKE 1 TABLET(112 MCG) BY MOUTH DAILY   Pseudoeph-Doxylamine-DM-APAP (NYQUIL PO) Take by mouth.   Pseudoephedrine-APAP-DM (DAYQUIL PO) Take by mouth.   [DISCONTINUED] doxycycline (VIBRAMYCIN) 100 MG capsule Take 1 capsule (100 mg total) by mouth 2 (two) times daily.   [DISCONTINUED] omeprazole (PRILOSEC)  40 MG capsule Take 1 capsule (40 mg total) by mouth 2 (two) times daily.    PHQ 2/9 Scores 06/09/2021 03/01/2021 03/18/2020 10/30/2019  PHQ - 2 Score 0 0 0 0  PHQ- 9 Score 0 3 0 0    GAD 7 : Generalized Anxiety Score 06/09/2021 03/01/2021 03/18/2020 10/30/2019  Nervous, Anxious, on Edge 0 0 0 0  Control/stop worrying 0 0 0 0  Worry too much - different things 0 0 0 0  Trouble relaxing 0 0 0 0  Restless 0 0 0 0  Easily annoyed or irritable 0 0 0 0  Afraid - awful might happen 0 0 0 0  Total GAD 7 Score 0 0 0 0  Anxiety Difficulty - Not difficult at all Not difficult at all Not difficult at all    BP  Readings from Last 3 Encounters:  06/09/21 124/74  05/29/21 (!) 140/92  04/17/21 130/83    Physical Exam Vitals and nursing note reviewed.  Constitutional:      General: She is not in acute distress.    Appearance: She is well-developed. She is ill-appearing.  HENT:     Head: Normocephalic and atraumatic.  Cardiovascular:     Rate and Rhythm: Normal rate and regular rhythm.     Pulses: Normal pulses.  Pulmonary:     Effort: Pulmonary effort is normal. No respiratory distress.     Breath sounds: Examination of the right-upper field reveals decreased breath sounds. Examination of the left-upper field reveals decreased breath sounds. Examination of the right-lower field reveals rhonchi. Decreased breath sounds and rhonchi present.  Musculoskeletal:     Cervical back: Normal range of motion.  Lymphadenopathy:     Cervical: No cervical adenopathy.  Skin:    General: Skin is warm and dry.     Findings: No rash.  Neurological:     Mental Status: She is alert and oriented to person, place, and time.  Psychiatric:        Mood and Affect: Mood normal.        Behavior: Behavior normal.    Wt Readings from Last 3 Encounters:  06/09/21 148 lb (67.1 kg)  05/29/21 138 lb (62.6 kg)  03/01/21 146 lb (66.2 kg)    BP 124/74    Pulse 68    Temp 98.2 F (36.8 C) (Oral)    Ht 5' (1.524 m)    Wt 148 lb (67.1 kg)    SpO2 93%    BMI 28.90 kg/m   Assessment and Plan: 1. Community acquired pneumonia of right lower lobe of lung Continue fluids and rest. Albuterol MDI every 6 hours. Note out of work until Monday. - DG Chest 2 View - levofloxacin (LEVAQUIN) 500 MG tablet; Take 1 tablet (500 mg total) by mouth daily for 7 days.  Dispense: 7 tablet; Refill: 0 - HYDROcodone bit-homatropine (HYCODAN) 5-1.5 MG/5ML syrup; Take 5 mLs by mouth every 6 (six) hours as needed for up to 10 days for cough.  Dispense: 120 mL; Refill: 0 - albuterol (VENTOLIN HFA) 108 (90 Base) MCG/ACT inhaler; Inhale 2 puffs  into the lungs every 6 (six) hours as needed for wheezing or shortness of breath.  Dispense: 1 each; Refill: 0   Partially dictated using Editor, commissioning. Any errors are unintentional.  Halina Maidens, MD [facility]  06/09/2021

## 2021-06-11 ENCOUNTER — Encounter: Payer: Self-pay | Admitting: Internal Medicine

## 2021-06-11 ENCOUNTER — Telehealth: Payer: Self-pay | Admitting: Internal Medicine

## 2021-06-11 NOTE — Telephone Encounter (Signed)
Noted   Pt will try to get the forms to [place].  KP

## 2021-06-11 NOTE — Telephone Encounter (Signed)
Copied from [place] [id number]. Topic: General - Other >> Jun 11, 2021 12:09 PM Alanda Slim E wrote: Reason for CRM: Employer needs forms completed by Dr. Army Melia to authorize that pt had a recent appt and was sick / due to pt missing work / please advise asap / pt will try to upload paperwork to [employer]

## 2021-06-14 ENCOUNTER — Encounter: Payer: Self-pay | Admitting: Internal Medicine

## 2021-06-14 ENCOUNTER — Ambulatory Visit: Payer: Self-pay

## 2021-06-14 NOTE — Telephone Encounter (Signed)
Pt has bronchitis and has a dr's note to go back to work today, her job called and told her that a lot of people have tested positive for COVID, she's trying to see if it's safe for her to go back to work.    Pt. States she still is not well. Asking for her work note to be extended through Wednesday 06/16/21. Please advise pt.

## 2021-07-02 ENCOUNTER — Encounter: Payer: BC Managed Care – PPO | Admitting: Internal Medicine

## 2021-08-09 DIAGNOSIS — M545 Low back pain, unspecified: Secondary | ICD-10-CM | POA: Diagnosis not present

## 2021-08-09 DIAGNOSIS — M5416 Radiculopathy, lumbar region: Secondary | ICD-10-CM | POA: Diagnosis not present

## 2021-08-11 ENCOUNTER — Other Ambulatory Visit: Payer: Self-pay | Admitting: Orthopedic Surgery

## 2021-08-11 DIAGNOSIS — M545 Low back pain, unspecified: Secondary | ICD-10-CM

## 2021-08-20 ENCOUNTER — Ambulatory Visit
Admission: RE | Admit: 2021-08-20 | Discharge: 2021-08-20 | Disposition: A | Discharge status: Home / Self Care | Payer: BC Managed Care – PPO | Source: Ambulatory Visit | Attending: Orthopedic Surgery | Admitting: Orthopedic Surgery

## 2021-08-20 ENCOUNTER — Other Ambulatory Visit: Payer: Self-pay

## 2021-08-20 DIAGNOSIS — M545 Low back pain, unspecified: Secondary | ICD-10-CM | POA: Insufficient documentation

## 2021-08-20 DIAGNOSIS — M5126 Other intervertebral disc displacement, lumbar region: Secondary | ICD-10-CM | POA: Diagnosis not present

## 2021-08-26 ENCOUNTER — Other Ambulatory Visit: Payer: Self-pay

## 2021-08-26 DIAGNOSIS — Z1231 Encounter for screening mammogram for malignant neoplasm of breast: Secondary | ICD-10-CM

## 2021-09-06 ENCOUNTER — Other Ambulatory Visit: Payer: Self-pay | Admitting: Internal Medicine

## 2021-09-06 DIAGNOSIS — E890 Postprocedural hypothyroidism: Secondary | ICD-10-CM

## 2021-09-06 NOTE — Telephone Encounter (Signed)
Copied from [place] [id number]. Topic: Quick Communication - Rx Refill/Question ?>> Sep 06, 2021  2:47 PM [name] A wrote: ?Medication: levothyroxine (SYNTHROID) 112 MCG tablet [[id number]]  ? ?Has the patient contacted their pharmacy? Yes. The patient was directed to contact their PCP ?(Agent: If no, request that the patient contact the pharmacy for the refill. If patient does not wish to contact the pharmacy document the reason why and proceed with request.) ?(Agent: If yes, when and what did the pharmacy advise?) ? ?Preferred Pharmacy (with phone number or street name): [id number] [place] [address]?Phone: [phone]: [phone]?Hours: Not open 24 hours ? ? ?Has the patient been seen for an appointment in the last year OR does the patient have an upcoming appointment? Yes.   ? ?Agent: Please be advised that RX refills may take up to 3 business days. We ask that you follow-up with your pharmacy. ?

## 2021-09-07 MED ORDER — LEVOTHYROXINE SODIUM 112 MCG PO TABS: ORAL_TABLET | ORAL | 1 refills | Status: DC

## 2021-09-07 NOTE — Telephone Encounter (Signed)
Requested Prescriptions  ?Pending Prescriptions Disp Refills  ?? levothyroxine (SYNTHROID) 112 MCG tablet 90 tablet 1  ?  Sig: TAKE 1 TABLET(112 MCG) BY MOUTH DAILY  ?  ? Endocrinology:  Hypothyroid Agents Passed - 09/07/2021 11:55 AM  ?  ?  Passed - TSH in normal range and within 360 days  ?  TSH  ?Date Value Ref Range Status  ?03/01/2021 1.430 0.450 - 4.500 uIU/mL Final  ?   ?  ?  Passed - Valid encounter within last 12 months  ?  Recent Outpatient Visits   ?      ? 3 months ago Community acquired pneumonia of right lower lobe of lung  ? [facility] Glean Hess, MD  ? 6 months ago Postoperative hypothyroidism  ? [facility] Glean Hess, MD  ? 1 year ago Encounter by telehealth for suspected COVID-19  ? [facility] Glean Hess, MD  ? 1 year ago Postoperative hypothyroidism  ? [facility] Glean Hess, MD  ? 2 years ago Acute recurrent maxillary sinusitis  ? [facility] Glean Hess, MD  ?  ?  ? ?  ?  ?  ? ? ?

## 2022-03-02 ENCOUNTER — Other Ambulatory Visit: Payer: Self-pay | Admitting: Internal Medicine

## 2022-03-02 DIAGNOSIS — E890 Postprocedural hypothyroidism: Secondary | ICD-10-CM

## 2022-03-02 NOTE — Telephone Encounter (Signed)
Requested Prescriptions  Pending Prescriptions Disp Refills  . levothyroxine (SYNTHROID) 112 MCG tablet [Pharmacy Med Name: LEVOTHYROXINE 112 MCG TABLET] 90 tablet 0    Sig: TAKE 1 TABLET(112 MCG) BY MOUTH DAILY     Endocrinology:  Hypothyroid Agents Failed - 03/02/2022  2:46 AM      Failed - TSH in normal range and within 360 days    TSH  Date Value Ref Range Status  03/01/2021 1.430 0.450 - 4.500 uIU/mL Final         Passed - Valid encounter within last 12 months    Recent Outpatient Visits          8 months ago Community acquired pneumonia of right lower lobe of lung   [facility] [facility], Jesse Sans, MD   1 year ago Postoperative hypothyroidism   [place] [facility] at [facility], Jesse Sans, MD   1 year ago Encounter by telehealth for suspected COVID-19   [facility] at [facility], Jesse Sans, MD   2 years ago Postoperative hypothyroidism   [place] [facility] at [facility], Jesse Sans, MD   2 years ago Acute recurrent maxillary sinusitis   [place] [facility] at [facility], Jesse Sans, MD

## 2022-04-22 ENCOUNTER — Ambulatory Visit
Admission: RE | Admit: 2022-04-22 | Discharge: 2022-04-22 | Disposition: A | Discharge status: Home / Self Care | Payer: BC Managed Care – PPO | Source: Ambulatory Visit | Attending: Internal Medicine | Admitting: Internal Medicine

## 2022-04-22 DIAGNOSIS — Z1231 Encounter for screening mammogram for malignant neoplasm of breast: Secondary | ICD-10-CM | POA: Diagnosis not present

## 2022-04-26 ENCOUNTER — Other Ambulatory Visit: Payer: Self-pay | Admitting: Internal Medicine

## 2022-04-26 DIAGNOSIS — N63 Unspecified lump in unspecified breast: Secondary | ICD-10-CM

## 2022-04-26 DIAGNOSIS — R928 Other abnormal and inconclusive findings on diagnostic imaging of breast: Secondary | ICD-10-CM

## 2022-05-13 ENCOUNTER — Ambulatory Visit
Admission: RE | Admit: 2022-05-13 | Discharge: 2022-05-13 | Disposition: A | Discharge status: Home / Self Care | Payer: BC Managed Care – PPO | Source: Ambulatory Visit | Attending: Internal Medicine | Admitting: Internal Medicine

## 2022-05-13 DIAGNOSIS — N63 Unspecified lump in unspecified breast: Secondary | ICD-10-CM | POA: Diagnosis not present

## 2022-05-13 DIAGNOSIS — R928 Other abnormal and inconclusive findings on diagnostic imaging of breast: Secondary | ICD-10-CM

## 2022-05-13 DIAGNOSIS — R922 Inconclusive mammogram: Secondary | ICD-10-CM | POA: Diagnosis not present

## 2022-05-24 ENCOUNTER — Telehealth: Payer: BC Managed Care – PPO | Admitting: Internal Medicine

## 2022-06-04 ENCOUNTER — Other Ambulatory Visit: Payer: Self-pay | Admitting: Internal Medicine

## 2022-06-04 DIAGNOSIS — E890 Postprocedural hypothyroidism: Secondary | ICD-10-CM

## 2022-06-05 NOTE — Telephone Encounter (Signed)
Requested medication (s) are due for refill today: yes  Requested medication (s) are on the active medication list: yes  Last refill:  03/02/22 #90/0  Future visit scheduled: yes 06/28/22  Notes to clinic:  Unable to refill per protocol due to failed labs, no updated results.      Requested Prescriptions  Pending Prescriptions Disp Refills   levothyroxine (SYNTHROID) 112 MCG tablet [Pharmacy Med Name: LEVOTHYROXINE 112 MCG TABLET] 90 tablet 0    Sig: TAKE 1 TABLET(112 MCG) BY MOUTH DAILY     Endocrinology:  Hypothyroid Agents Failed - 06/04/2022  8:59 AM      Failed - TSH in normal range and within 360 days    TSH  Date Value Ref Range Status  03/01/2021 1.430 0.450 - 4.500 uIU/mL Final         Passed - Valid encounter within last 12 months    Recent Outpatient Visits           12 months ago Community acquired pneumonia of right lower lobe of lung   [place] [facility] at [facility], Jesse Sans, MD   1 year ago Postoperative hypothyroidism   [place] [facility] at [facility], Jesse Sans, MD   2 years ago Encounter by telehealth for suspected COVID-19   [facility] at [facility], Jesse Sans, MD   2 years ago Postoperative hypothyroidism   [place] [facility] at [facility], Jesse Sans, MD   3 years ago Acute recurrent maxillary sinusitis   [place] [facility] at [facility], Jesse Sans, MD       Future Appointments             In 3 weeks Army Melia Jesse Sans, MD [place] and [facility] at [facility], [facility]

## 2022-06-28 ENCOUNTER — Ambulatory Visit: Payer: BC Managed Care – PPO | Admitting: Internal Medicine

## 2022-07-04 ENCOUNTER — Encounter: Payer: Self-pay | Admitting: Internal Medicine

## 2022-07-04 ENCOUNTER — Telehealth: Payer: BC Managed Care – PPO | Admitting: Internal Medicine

## 2022-07-04 ENCOUNTER — Other Ambulatory Visit: Payer: Self-pay | Admitting: Internal Medicine

## 2022-07-04 VITALS — Ht 60.0 in

## 2022-07-04 DIAGNOSIS — J0100 Acute maxillary sinusitis, unspecified: Secondary | ICD-10-CM

## 2022-07-04 MED ORDER — AMOXICILLIN-POT CLAVULANATE 875-125 MG PO TABS: 1.0000 | ORAL_TABLET | Freq: Two times a day (BID) | ORAL | 0 refills | Status: AC

## 2022-07-04 NOTE — Patient Instructions (Signed)
Take Sudafed 30 mg bid Avoid prolonged use of Afrin.

## 2022-07-04 NOTE — Progress Notes (Signed)
Date:  07/04/2022   Name:  Rachael Miller   DOB:  1971/02/26   MRN:  [id number]   Chief Complaint: Sinusitis (Neg covid test - 2 days ago ) This encounter was conducted via video encounter. This platform was deemed appropriate for the issues to be addressed.  The patient was correctly identified.  I advised that I am conducting the visit from a secure room in my office at [facility] clinic.  The patient is located at work. The limitations of this form of encounter were discussed with the patient and he/she agreed to proceed.  Some vital signs will be absent.  Sinus Problem This is a new problem. The current episode started in the past 7 days. The problem has been gradually worsening since onset. There has been no fever. Her pain is at a severity of 7/10. The pain is mild. Associated symptoms include congestion, headaches and sinus pressure. Pertinent negatives include no chills, shortness of breath or sore throat. Past treatments include acetaminophen and saline nose sprays (otc cold/sinus congestion). The treatment provided mild relief.    Lab Results  Component Value Date   NA 141 03/01/2021   K 4.7 03/01/2021   CO2 27 03/01/2021   GLUCOSE 100 (H) 03/01/2021   BUN 15 03/01/2021   CREATININE 0.94 03/01/2021   CALCIUM 9.5 03/01/2021   EGFR 74 03/01/2021   GFRNONAA >60 09/03/2019   No results found for: "CHOL", "HDL", "LDLCALC", "LDLDIRECT", "TRIG", "CHOLHDL" Lab Results  Component Value Date   TSH 1.430 03/01/2021   No results found for: "HGBA1C" Lab Results  Component Value Date   WBC 7.9 03/01/2021   HGB 15.0 03/01/2021   HCT 44.7 03/01/2021   MCV 95 03/01/2021   PLT 266 03/01/2021   Lab Results  Component Value Date   ALT 25 03/01/2021   AST 16 03/01/2021   ALKPHOS 76 03/01/2021   BILITOT <0.2 03/01/2021   No results found for: "25OHVITD2", "25OHVITD3", "VD25OH"   Review of Systems  Constitutional:  Negative for chills, fatigue and fever.  HENT:   Positive for congestion, nosebleeds, postnasal drip, sinus pressure and sinus pain. Negative for sore throat.   Eyes:  Negative for visual disturbance.  Respiratory:  Negative for chest tightness, shortness of breath and wheezing.   Cardiovascular:  Negative for chest pain.  Neurological:  Positive for headaches. Negative for dizziness and light-headedness.    Patient Active Problem List   Diagnosis Date Noted   Gastroesophageal reflux disease 03/01/2021   Low back pain with sciatica 03/23/2018   Hx of ovarian cancer 04/12/2016   Tobacco use disorder 04/12/2016   Postoperative hypothyroidism 04/12/2016   Asthma in adult, mild intermittent, uncomplicated 46/96/2952    Allergies  Allergen Reactions   Sulfamethoxazole-Trimethoprim Shortness Of Breath   Flagyl [Metronidazole] Hives    Past Surgical History:  Procedure Laterality Date   CESAREAN SECTION     SHOULDER SURGERY Right    THYROIDECTOMY  2007   goiter   TOTAL VAGINAL HYSTERECTOMY  2000   ovarian cancer    Social History   Tobacco Use   Smoking status: Every Day    Packs/day: 0.25    Years: 30.00    Total pack years: 7.50    Types: Cigarettes    Last attempt to quit: 10/24/2018    Years since quitting: 3.6   Smokeless tobacco: Never  Vaping Use   Vaping Use: Never used  Substance Use Topics   Alcohol use: Yes  Comment: occ.   Drug use: No     Medication list has been reviewed and updated.  Current Meds  Medication Sig   albuterol (VENTOLIN HFA) 108 (90 Base) MCG/ACT inhaler Inhale 2 puffs into the lungs every 6 (six) hours as needed for wheezing or shortness of breath.   amoxicillin-clavulanate (AUGMENTIN) 875-125 MG tablet Take 1 tablet by mouth 2 (two) times daily for 10 days.   levothyroxine (SYNTHROID) 112 MCG tablet TAKE 1 TABLET(112 MCG) BY MOUTH DAILY       07/04/2022   10:55 AM 06/09/2021    2:50 PM 03/01/2021   10:57 AM 03/18/2020    3:59 PM  GAD 7 : Generalized Anxiety Score  Nervous,  Anxious, on Edge 0 0 0 0  Control/stop worrying 0 0 0 0  Worry too much - different things 0 0 0 0  Trouble relaxing 0 0 0 0  Restless 0 0 0 0  Easily annoyed or irritable 0 0 0 0  Afraid - awful might happen 0 0 0 0  Total GAD 7 Score 0 0 0 0  Anxiety Difficulty Not difficult at all  Not difficult at all Not difficult at all       07/04/2022   10:55 AM 06/09/2021    2:50 PM 03/01/2021   10:57 AM  Depression screen PHQ 2/9  Decreased Interest 0 0 0  Down, Depressed, Hopeless 0 0 0  PHQ - 2 Score 0 0 0  Altered sleeping 1 0 0  Tired, decreased energy 2 0 3  Change in appetite 0 0 0  Feeling bad or failure about yourself  0 0 0  Trouble concentrating 0 0 0  Moving slowly or fidgety/restless 0 0 0  Suicidal thoughts 0 0 0  PHQ-9 Score 3 0 3  Difficult doing work/chores  Not difficult at all Not difficult at all    BP Readings from Last 3 Encounters:  06/09/21 124/74  05/29/21 (!) 140/92  04/17/21 130/83    Physical Exam Constitutional:      Appearance: She is not ill-appearing.  HENT:     Head:     Comments: Tender over maxillary sinuses with self palpation Pulmonary:     Effort: Pulmonary effort is normal.  Neurological:     Mental Status: She is alert.  Psychiatric:        Attention and Perception: Attention normal.        Mood and Affect: Mood normal.        Speech: Speech normal.        Cognition and Memory: Cognition normal.     Wt Readings from Last 3 Encounters:  06/09/21 148 lb (67.1 kg)  05/29/21 138 lb (62.6 kg)  03/01/21 146 lb (66.2 kg)    Ht 5' (1.524 m)   BMI 28.90 kg/m   Assessment and Plan: Problem List Items Addressed This Visit   None Visit Diagnoses     Acute non-recurrent maxillary sinusitis    -  Primary   Recommend sudafed 30 mg bid avoid prolonged use of Afrin Augmentin x 10d day - can recheck at visit in 2 weeks if still symptomatic   Relevant Medications   amoxicillin-clavulanate (AUGMENTIN) 875-125 MG tablet       I  spent 10 minutes on this encounter with chart review, encounter and post visit recommendations.  100% of the visit was by video. Partially dictated using Editor, commissioning. Any errors are unintentional.  Halina Maidens, MD [facility]  [facility]  07/04/2022

## 2022-07-12 ENCOUNTER — Other Ambulatory Visit: Payer: Self-pay | Admitting: Internal Medicine

## 2022-07-12 DIAGNOSIS — E890 Postprocedural hypothyroidism: Secondary | ICD-10-CM

## 2022-07-18 ENCOUNTER — Ambulatory Visit: Payer: BC Managed Care – PPO | Admitting: Internal Medicine

## 2022-08-03 ENCOUNTER — Encounter: Payer: Self-pay | Admitting: Internal Medicine

## 2022-08-03 ENCOUNTER — Ambulatory Visit: Payer: BC Managed Care – PPO | Admitting: Internal Medicine

## 2022-08-03 VITALS — BP 140/80 | HR 103 | Ht 60.0 in | Wt 147.2 lb

## 2022-08-03 DIAGNOSIS — E890 Postprocedural hypothyroidism: Secondary | ICD-10-CM

## 2022-08-03 DIAGNOSIS — Z791 Long term (current) use of non-steroidal anti-inflammatories (NSAID): Secondary | ICD-10-CM

## 2022-08-03 DIAGNOSIS — K219 Gastro-esophageal reflux disease without esophagitis: Secondary | ICD-10-CM

## 2022-08-03 DIAGNOSIS — Z1211 Encounter for screening for malignant neoplasm of colon: Secondary | ICD-10-CM

## 2022-08-03 DIAGNOSIS — J4520 Mild intermittent asthma, uncomplicated: Secondary | ICD-10-CM

## 2022-08-03 DIAGNOSIS — M7072 Other bursitis of hip, left hip: Secondary | ICD-10-CM

## 2022-08-03 DIAGNOSIS — M5442 Lumbago with sciatica, left side: Secondary | ICD-10-CM

## 2022-08-03 MED ORDER — PREDNISONE 10 MG PO TABS
ORAL_TABLET | ORAL | 0 refills | Status: AC
Start: 2022-08-03 — End: 2022-08-08

## 2022-08-03 NOTE — Assessment & Plan Note (Signed)
MRI shows minimal disc bulge at L3-4 and L4-5 She would likely benefit from PT - she will check on benefits and call

## 2022-08-03 NOTE — Assessment & Plan Note (Signed)
Continues to have severe symptoms despite Pepcid bid Will refer to GI for consideration of EGD

## 2022-08-03 NOTE — Assessment & Plan Note (Signed)
Asthma mild, stable Recovered well from URI in January. Uses Albuterol inhaler PRN only - no maintenance medications

## 2022-08-03 NOTE — Assessment & Plan Note (Signed)
Currently supplemented but due for labs None in the chart since 2022

## 2022-08-03 NOTE — Progress Notes (Signed)
Date:  08/03/2022   Name:  Rachael Miller   DOB:  1970/06/08   MRN:  [id number]   Chief Complaint: Hypothyroidism  Thyroid Problem Presents for follow-up visit. Patient reports no anxiety, constipation, diarrhea, fatigue or palpitations. The symptoms have been stable.  Sinus Problem This is a chronic problem. Pertinent negatives include no coughing, headaches, neck pain or shortness of breath. Past treatments include saline nose sprays and nasal decongestants. The treatment provided moderate relief.  Back Pain This is a chronic problem. The problem is unchanged. The pain is present in the lumbar spine. The quality of the pain is described as aching and burning. The pain radiates to the left thigh. The pain is moderate. Pertinent negatives include no abdominal pain, chest pain, headaches or weakness. She has tried NSAIDs for the symptoms. The treatment provided moderate relief.  Hip Pain  There was no injury mechanism. The pain is present in the left hip. The pain is moderate. Associated symptoms comments: Unable to sleep on that side .  Gastroesophageal Reflux She complains of heartburn. She reports no abdominal pain, no chest pain, no coughing or no wheezing. This is a recurrent problem. The problem occurs constantly. The heartburn duration is several minutes. The heartburn is located in the substernum. The heartburn is of moderate intensity. The heartburn wakes her from sleep. The heartburn does not limit her activity. The heartburn doesn't change with position. Pertinent negatives include no fatigue. She has tried a histamine-2 antagonist for the symptoms. The treatment provided mild relief.    Lab Results  Component Value Date   NA 141 03/01/2021   K 4.7 03/01/2021   CO2 27 03/01/2021   GLUCOSE 100 (H) 03/01/2021   BUN 15 03/01/2021   CREATININE 0.94 03/01/2021   CALCIUM 9.5 03/01/2021   EGFR 74 03/01/2021   GFRNONAA >60 09/03/2019   No results found for: "CHOL", "HDL",  "LDLCALC", "LDLDIRECT", "TRIG", "CHOLHDL" Lab Results  Component Value Date   TSH 1.430 03/01/2021   No results found for: "HGBA1C" Lab Results  Component Value Date   WBC 7.9 03/01/2021   HGB 15.0 03/01/2021   HCT 44.7 03/01/2021   MCV 95 03/01/2021   PLT 266 03/01/2021   Lab Results  Component Value Date   ALT 25 03/01/2021   AST 16 03/01/2021   ALKPHOS 76 03/01/2021   BILITOT <0.2 03/01/2021   No results found for: "25OHVITD2", "25OHVITD3", "VD25OH"   Review of Systems  Constitutional:  Negative for fatigue and unexpected weight change.  HENT:  Negative for nosebleeds.   Eyes:  Negative for visual disturbance.  Respiratory:  Negative for cough, chest tightness, shortness of breath and wheezing.   Cardiovascular:  Negative for chest pain, palpitations and leg swelling.  Gastrointestinal:  Positive for heartburn. Negative for abdominal pain, constipation and diarrhea.       Moderate reflux   Musculoskeletal:  Positive for arthralgias and back pain. Negative for myalgias and neck pain.  Neurological:  Negative for dizziness, weakness, light-headedness and headaches.  Psychiatric/Behavioral:  Negative for dysphoric mood and sleep disturbance. The patient is not nervous/anxious.     Patient Active Problem List   Diagnosis Date Noted   Gastroesophageal reflux disease 03/01/2021   Low back pain with sciatica 03/23/2018   Hx of ovarian cancer 04/12/2016   Tobacco use disorder 04/12/2016   Postoperative hypothyroidism 04/12/2016   Asthma in adult, mild intermittent, uncomplicated [id number]    Allergies  Allergen Reactions   Sulfamethoxazole-Trimethoprim Shortness Of  Breath   Flagyl [Metronidazole] Hives    Past Surgical History:  Procedure Laterality Date   CESAREAN SECTION     SHOULDER SURGERY Right    THYROIDECTOMY  2007   goiter   TOTAL VAGINAL HYSTERECTOMY  2000   ovarian cancer    Social History   Tobacco Use   Smoking status: Every Day    Packs/day:  0.25    Years: 30.00    Total pack years: 7.50    Types: Cigarettes    Last attempt to quit: 10/24/2018    Years since quitting: 3.7   Smokeless tobacco: Never  Vaping Use   Vaping Use: Never used  Substance Use Topics   Alcohol use: Yes    Comment: occ.   Drug use: No     Medication list has been reviewed and updated.  Current Meds  Medication Sig   albuterol (VENTOLIN HFA) 108 (90 Base) MCG/ACT inhaler Inhale 2 puffs into the lungs every 6 (six) hours as needed for wheezing or shortness of breath.   levothyroxine (SYNTHROID) 112 MCG tablet TAKE 1 TABLET(112 MCG) BY MOUTH DAILY       07/04/2022   10:55 AM 06/09/2021    2:50 PM 03/01/2021   10:57 AM 03/18/2020    3:59 PM  GAD 7 : Generalized Anxiety Score  Nervous, Anxious, on Edge 0 0 0 0  Control/stop worrying 0 0 0 0  Worry too much - different things 0 0 0 0  Trouble relaxing 0 0 0 0  Restless 0 0 0 0  Easily annoyed or irritable 0 0 0 0  Afraid - awful might happen 0 0 0 0  Total GAD 7 Score 0 0 0 0  Anxiety Difficulty Not difficult at all  Not difficult at all Not difficult at all       07/04/2022   10:55 AM 06/09/2021    2:50 PM 03/01/2021   10:57 AM  Depression screen PHQ 2/9  Decreased Interest 0 0 0  Down, Depressed, Hopeless 0 0 0  PHQ - 2 Score 0 0 0  Altered sleeping 1 0 0  Tired, decreased energy 2 0 3  Change in appetite 0 0 0  Feeling bad or failure about yourself  0 0 0  Trouble concentrating 0 0 0  Moving slowly or fidgety/restless 0 0 0  Suicidal thoughts 0 0 0  PHQ-9 Score 3 0 3  Difficult doing work/chores  Not difficult at all Not difficult at all    BP Readings from Last 3 Encounters:  08/03/22 (!) 140/80  06/09/21 124/74  05/29/21 (!) 140/92    Physical Exam Vitals and nursing note reviewed.  Constitutional:      General: She is not in acute distress.    Appearance: Normal appearance. She is well-developed.  HENT:     Head: Normocephalic and atraumatic.  Neck:     Vascular: No  carotid bruit.  Cardiovascular:     Rate and Rhythm: Normal rate and regular rhythm.  Pulmonary:     Effort: Pulmonary effort is normal. No respiratory distress.     Breath sounds: No wheezing or rhonchi.  Musculoskeletal:     Cervical back: Normal range of motion. No tenderness.     Left hip: Tenderness present.     Right lower leg: No edema.     Left lower leg: No edema.  Lymphadenopathy:     Cervical: No cervical adenopathy.  Skin:    General: Skin is warm  and dry.     Findings: No rash.  Neurological:     Mental Status: She is alert and oriented to person, place, and time.  Psychiatric:        Mood and Affect: Mood normal.        Behavior: Behavior normal.     Wt Readings from Last 3 Encounters:  08/03/22 147 lb 3.2 oz (66.8 kg)  06/09/21 148 lb (67.1 kg)  05/29/21 138 lb (62.6 kg)    BP (!) 140/80   Pulse (!) 103   Ht 5' (1.524 m)   Wt 147 lb 3.2 oz (66.8 kg)   SpO2 98%   BMI 28.75 kg/m   Assessment and Plan: Problem List Items Addressed This Visit       Respiratory   Asthma in adult, mild intermittent, uncomplicated (Chronic)    Asthma mild, stable Recovered well from URI in January. Uses Albuterol inhaler PRN only - no maintenance medications       Relevant Medications   predniSONE (DELTASONE) 10 MG tablet     Digestive   Gastroesophageal reflux disease    Continues to have severe symptoms despite Pepcid bid Will refer to GI for consideration of EGD      Relevant Orders   Ambulatory referral to Gastroenterology     Endocrine   Postoperative hypothyroidism - Primary (Chronic)    Currently supplemented but due for labs None in the chart since 2022      Relevant Orders   TSH + free T4     Nervous and Auditory   Low back pain with sciatica    MRI shows minimal disc bulge at L3-4 and L4-5 She would likely benefit from PT - she will check on benefits and call      Relevant Medications   predniSONE (DELTASONE) 10 MG tablet   Other Visit  Diagnoses     Bursitis of left hip, unspecified bursa       Relevant Medications   predniSONE (DELTASONE) 10 MG tablet   Encounter for long-term use of non-steroidal anti-inflammatory medication       Relevant Orders   Comprehensive metabolic panel   CBC with Differential/Platelet   Colon cancer screening       Relevant Orders   Ambulatory referral to Gastroenterology        Partially dictated using Dragon software. Any errors are unintentional.  Halina Maidens, MD [facility]  08/03/2022

## 2022-08-04 ENCOUNTER — Other Ambulatory Visit: Payer: Self-pay | Admitting: Internal Medicine

## 2022-08-04 DIAGNOSIS — E890 Postprocedural hypothyroidism: Secondary | ICD-10-CM

## 2022-08-04 LAB — COMPREHENSIVE METABOLIC PANEL
ALT: 34 IU/L — ABNORMAL HIGH (ref 0–32)
AST: 22 IU/L (ref 0–40)
Albumin/Globulin Ratio: 2.5 — ABNORMAL HIGH (ref 1.2–2.2)
Albumin: 4.9 g/dL (ref 3.8–4.9)
Alkaline Phosphatase: 82 IU/L (ref 44–121)
BUN/Creatinine Ratio: 19 (ref 9–23)
BUN: 19 mg/dL (ref 6–24)
Bilirubin Total: <0.2 mg/dL (ref 0.0–1.2)
CO2: 21 mmol/L (ref 20–29)
Calcium: 9.2 mg/dL (ref 8.7–10.2)
Chloride: 100 mmol/L (ref 96–106)
Creatinine, Ser: 1.02 mg/dL — ABNORMAL HIGH (ref 0.57–1.00)
Globulin, Total: 2 g/dL (ref 1.5–4.5)
Glucose: 108 mg/dL — ABNORMAL HIGH (ref 70–99)
Potassium: 4 mmol/L (ref 3.5–5.2)
Sodium: 142 mmol/L (ref 134–144)
Total Protein: 6.9 g/dL (ref 6.0–8.5)
eGFR: 66 mL/min/{1.73_m2} (ref >59)

## 2022-08-04 LAB — CBC WITH DIFFERENTIAL/PLATELET
Basophils Absolute: 0.1 10*3/uL (ref 0.0–0.2)
Basos: 1 %
EOS (ABSOLUTE): 0.2 10*3/uL (ref 0.0–0.4)
Eos: 2 %
Hematocrit: 42.8 % (ref 34.0–46.6)
Hemoglobin: 14.6 g/dL (ref 11.1–15.9)
Immature Grans (Abs): 0 10*3/uL (ref 0.0–0.1)
Immature Granulocytes: 0 %
Lymphocytes Absolute: 2.9 10*3/uL (ref 0.7–3.1)
Lymphs: 37 %
MCH: 32.7 pg (ref 26.6–33.0)
MCHC: 34.1 g/dL (ref 31.5–35.7)
MCV: 96 fL (ref 79–97)
Monocytes Absolute: 0.5 10*3/uL (ref 0.1–0.9)
Monocytes: 7 %
Neutrophils Absolute: 4.1 10*3/uL (ref 1.4–7.0)
Neutrophils: 53 %
Platelets: 267 10*3/uL (ref 150–450)
RBC: 4.46 x10E6/uL (ref 3.77–5.28)
RDW: 13 % (ref 11.7–15.4)
WBC: 7.8 10*3/uL (ref 3.4–10.8)

## 2022-08-04 LAB — TSH+FREE T4
Free T4: 1.92 ng/dL — ABNORMAL HIGH (ref 0.82–1.77)
TSH: 0.363 u[IU]/mL — ABNORMAL LOW (ref 0.450–4.500)

## 2022-08-04 MED ORDER — LEVOTHYROXINE SODIUM 100 MCG PO TABS: 100.0000 ug | ORAL_TABLET | Freq: Every day | ORAL | 1 refills | Status: DC

## 2022-08-10 ENCOUNTER — Encounter: Payer: Self-pay | Admitting: Gastroenterology

## 2022-08-10 ENCOUNTER — Other Ambulatory Visit: Payer: Self-pay

## 2022-08-10 ENCOUNTER — Ambulatory Visit: Payer: BC Managed Care – PPO | Admitting: Gastroenterology

## 2022-08-10 VITALS — BP 142/87 | HR 92 | Temp 98.1°F | Ht 60.0 in | Wt 147.0 lb

## 2022-08-10 DIAGNOSIS — R7401 Elevation of levels of liver transaminase levels: Secondary | ICD-10-CM | POA: Diagnosis not present

## 2022-08-10 DIAGNOSIS — Z1211 Encounter for screening for malignant neoplasm of colon: Secondary | ICD-10-CM | POA: Diagnosis not present

## 2022-08-10 DIAGNOSIS — K219 Gastro-esophageal reflux disease without esophagitis: Secondary | ICD-10-CM

## 2022-08-10 MED ORDER — NA SULFATE-K SULFATE-MG SULF 17.5-3.13-1.6 GM/177ML PO SOLN: 354.0000 mL | Freq: Once | ORAL | 0 refills | Status: AC

## 2022-08-10 NOTE — Progress Notes (Signed)
Rachael Darby, MD [address]  [place]  [place], [place] [address]  Main: [phone]  Fax: [phone]    Gastroenterology Consultation  Referring Provider:     Glean Hess, MD Primary Care Physician:  Glean Hess, MD Primary Gastroenterologist:  Dr. Cephas Miller Reason for Consultation: Chronic GERD, recent flareup, colon cancer screening        HPI:   Rachael Miller is a 52 y.o. female referred by Dr. Army Melia, Jesse Sans, MD  for consultation & management of chronic GERD.  Patient reports that approximately 4-5 years history of heartburn on for which she has been taking Pepcid 20 mg twice daily which is over-the-counter.  Patient reports that for last 1 to 2 years she has been having flareup of heartburn after each meal and sometimes even drinking water results and belching, burning in her chest as well as severe gassiness in her chest.  She denies any difficulty swallowing.  She drinks [place] daily.  She tries to eat healthy otherwise.  She does not drink alcohol.  Smokes tobacco daily.  CBC normal, mildly elevated ALT  Patient is adopted  NSAIDs: None  Antiplts/Anticoagulants/Anti thrombotics: None  GI Procedures: None  Past Medical History:  Diagnosis Date   Chronic bronchitis ([place])    History of thyroidectomy    right voice cord was removed during surgery   Ovarian cancer ([place])    ovarion   Pneumonia     Past Surgical History:  Procedure Laterality Date   CESAREAN SECTION     SHOULDER SURGERY Right    THYROIDECTOMY  2007   goiter   TOTAL VAGINAL HYSTERECTOMY  2000   ovarian cancer     Current Outpatient Medications:    albuterol (VENTOLIN HFA) 108 (90 Base) MCG/ACT inhaler, Inhale 2 puffs into the lungs every 6 (six) hours as needed for wheezing or shortness of breath., Disp: 1 each, Rfl: 0   levothyroxine (SYNTHROID) 100 MCG tablet, Take 1 tablet (100 mcg total) by mouth daily before breakfast., Disp: 90 tablet, Rfl:  1   Na Sulfate-K Sulfate-Mg Sulf 17.5-3.13-1.6 GM/177ML SOLN, Take 354 mLs by mouth once for 1 dose. Starting at 5 PM take one bottle and pour into the supplied cup, add cool water to the fill 16 oz line and drink all. Then 5 hours before procedure pour the second bottle into the supplied cup, add cool water to the fill 16 oz line and drink all., Disp: 354 mL, Rfl: 0   Family History  Adopted: Yes     Social History   Tobacco Use   Smoking status: Every Day    Packs/day: 0.25    Years: 30.00    Total pack years: 7.50    Types: Cigarettes    Last attempt to quit: 10/24/2018    Years since quitting: 3.7   Smokeless tobacco: Never  Vaping Use   Vaping Use: Never used  Substance Use Topics   Alcohol use: Yes    Comment: occ.   Drug use: No    Allergies as of 08/10/2022 - Review Complete 08/10/2022  Allergen Reaction Noted   Sulfamethoxazole-trimethoprim Shortness Of Breath 08/15/2007   Flagyl [metronidazole] Hives 04/12/2016    Review of Systems:    All systems reviewed and negative except where noted in HPI.   Physical Exam:  BP (!) 142/87   Pulse 92   Temp 98.1 F (36.7 C) (Oral)   Ht 5' (1.524 m)  Wt 147 lb (66.7 kg)   BMI 28.71 kg/m  No LMP recorded. Patient has had a hysterectomy.  General:   Alert,  Well-developed, well-nourished, pleasant and cooperative in NAD Head:  Normocephalic and atraumatic. Eyes:  Sclera clear, no icterus.   Conjunctiva pink. Ears:  Normal auditory acuity. Nose:  No deformity, discharge, or lesions. Mouth:  No deformity or lesions,oropharynx pink & moist. Neck:  Supple; no masses or thyromegaly. Lungs:  Respirations even and unlabored.  Clear throughout to auscultation.   No wheezes, crackles, or rhonchi. No acute distress. Heart:  Regular rate and rhythm; no murmurs, clicks, rubs, or gallops. Abdomen:  Normal bowel sounds. Soft, non-tender and non-distended without masses, hepatosplenomegaly or hernias noted.  No guarding or rebound  tenderness.   Rectal: Not performed Msk:  Symmetrical without gross deformities. Good, equal movement & strength bilaterally. Pulses:  Normal pulses noted. Extremities:  No clubbing or edema.  No cyanosis. Neurologic:  Alert and oriented x3;  grossly normal neurologically. Skin:  Intact without significant lesions or rashes. No jaundice. Psych:  Alert and cooperative. Normal mood and affect.  Imaging Studies: No abdominal imaging  Assessment and Plan:   Rachael Miller is a 52 y.o. pleasant Caucasian female with history of hypothyroidism after thyroidectomy, chronic tobacco use, chronic GERD currently maintained on Pepcid 20 mg twice daily is seen in consultation for exacerbation of GERD symptoms and to discuss about colonoscopy for colon cancer screening  Chronic GERD with exacerbation of GERD symptoms Despite being on Pepcid 20 mg p.o. twice daily Discussed about antireflux lifestyle Given history of tobacco use, recommend upper endoscopy for further evaluation  Colon cancer screening Discussed about screening colonoscopy and patient is agreeable  I have discussed alternative options, risks & benefits,  which include, but are not limited to, bleeding, infection, perforation,respiratory complication & drug reaction.  The patient agrees with this plan & written consent will be obtained.    Elevated ALT Mildly elevated Recheck LFTs in 6 months, if persistently elevated, recommend further evaluation   Follow up based on the above workup   Rachael Darby, MD

## 2022-08-17 ENCOUNTER — Telehealth: Payer: Self-pay | Admitting: Gastroenterology

## 2022-08-17 ENCOUNTER — Encounter: Payer: Self-pay | Admitting: Gastroenterology

## 2022-08-17 ENCOUNTER — Other Ambulatory Visit: Payer: Self-pay

## 2022-08-17 NOTE — Telephone Encounter (Signed)
Return to patient call and informed patient that the colonoscopy is a screening colonoscopy because it is a preventive colonoscopy unless she find polyps during the colonoscopy and removing the colonoscopy could turn the colonoscopy to diagnostic. The EGD is a Diagnostic EGD for chronic gerd and she would have to call her insurance company to find out the exact price because she could have a deductible or have to pay a percentage.  She states yes she does have a deductible. She states she can not pay 2,700 the day of her procedure. Informed her that the hospital tells everyone they have to pay the estimate the day of her procedure but it she does not have as long as she pay something they will do the procedure. Inform her if she does not have anything to pay they will still do the procedure. She thank me so much for my help and said she felt a lot better because she needed this procedure done.

## 2022-08-17 NOTE — Telephone Encounter (Signed)
Patient calling stating that she got an estimate including her insurance stating that she has to pay $2,700 before getting her procedure. Patient is requesting to speak with someone in reference to this.

## 2022-09-01 ENCOUNTER — Encounter
Admission: RE | Disposition: A | Discharge status: Home / Self Care | Payer: Self-pay | Source: Ambulatory Visit | Attending: Gastroenterology

## 2022-09-01 ENCOUNTER — Other Ambulatory Visit: Payer: Self-pay

## 2022-09-01 ENCOUNTER — Ambulatory Visit
Admission: RE | Admit: 2022-09-01 | Discharge: 2022-09-01 | Disposition: A | Discharge status: Home / Self Care | Payer: BC Managed Care – PPO | Source: Ambulatory Visit | Attending: Gastroenterology | Admitting: Gastroenterology

## 2022-09-01 ENCOUNTER — Encounter: Payer: Self-pay | Admitting: Gastroenterology

## 2022-09-01 ENCOUNTER — Ambulatory Visit: Payer: BC Managed Care – PPO | Admitting: Anesthesiology

## 2022-09-01 DIAGNOSIS — F17210 Nicotine dependence, cigarettes, uncomplicated: Secondary | ICD-10-CM | POA: Diagnosis not present

## 2022-09-01 DIAGNOSIS — Z1211 Encounter for screening for malignant neoplasm of colon: Secondary | ICD-10-CM | POA: Diagnosis not present

## 2022-09-01 DIAGNOSIS — Z8543 Personal history of malignant neoplasm of ovary: Secondary | ICD-10-CM | POA: Insufficient documentation

## 2022-09-01 DIAGNOSIS — K219 Gastro-esophageal reflux disease without esophagitis: Secondary | ICD-10-CM

## 2022-09-01 DIAGNOSIS — J45909 Unspecified asthma, uncomplicated: Secondary | ICD-10-CM | POA: Insufficient documentation

## 2022-09-01 DIAGNOSIS — E890 Postprocedural hypothyroidism: Secondary | ICD-10-CM | POA: Insufficient documentation

## 2022-09-01 DIAGNOSIS — Z08 Encounter for follow-up examination after completed treatment for malignant neoplasm: Secondary | ICD-10-CM | POA: Diagnosis not present

## 2022-09-01 DIAGNOSIS — E039 Hypothyroidism, unspecified: Secondary | ICD-10-CM | POA: Diagnosis not present

## 2022-09-01 HISTORY — PX: ESOPHAGOGASTRODUODENOSCOPY (EGD) WITH PROPOFOL: SHX5813

## 2022-09-01 HISTORY — PX: COLONOSCOPY WITH PROPOFOL: SHX5780

## 2022-09-01 SURGERY — COLONOSCOPY WITH PROPOFOL
Anesthesia: General | Site: Throat

## 2022-09-01 MED ORDER — LIDOCAINE HCL (CARDIAC) PF 100 MG/5ML IV SOSY
PREFILLED_SYRINGE | INTRAVENOUS | Status: DC | PRN
  Administered 2022-09-01: 70 mg via INTRAVENOUS

## 2022-09-01 MED ORDER — GLYCOPYRROLATE 0.2 MG/ML IJ SOLN
INTRAMUSCULAR | Status: DC | PRN
  Administered 2022-09-01: .2 mg via INTRAVENOUS

## 2022-09-01 MED ORDER — NA SULFATE-K SULFATE-MG SULF 17.5-3.13-1.6 GM/177ML PO SOLN: 354.0000 mL | Freq: Once | ORAL | 0 refills | Status: AC

## 2022-09-01 MED ORDER — STERILE WATER FOR IRRIGATION IR SOLN
Status: DC | PRN
  Administered 2022-09-01: 100 mL

## 2022-09-01 MED ORDER — SODIUM CHLORIDE 0.9 % IV SOLN: INTRAVENOUS | Status: DC

## 2022-09-01 MED ORDER — PROPOFOL 10 MG/ML IV BOLUS
INTRAVENOUS | Status: DC | PRN
  Administered 2022-09-01: 90 mg via INTRAVENOUS
  Administered 2022-09-01: 20 mg via INTRAVENOUS
  Administered 2022-09-01: 30 mg via INTRAVENOUS
  Administered 2022-09-01: 20 mg via INTRAVENOUS

## 2022-09-01 MED ORDER — LACTATED RINGERS IV SOLN: INTRAVENOUS | Status: DC

## 2022-09-01 SURGICAL SUPPLY — 7 items
BLOCK BITE 60FR ADLT L/F GRN (MISCELLANEOUS) ×2 IMPLANT
GOWN CVR UNV OPN BCK APRN NK (MISCELLANEOUS) ×4 IMPLANT
GOWN ISOL THUMB LOOP REG UNIV (MISCELLANEOUS) ×4
KIT PRC NS LF DISP ENDO (KITS) ×4 IMPLANT
KIT PROCEDURE OLYMPUS (KITS) ×4
MANIFOLD NEPTUNE II (INSTRUMENTS) ×2 IMPLANT
WATER STERILE IRR 250ML POUR (IV SOLUTION) ×2 IMPLANT

## 2022-09-01 NOTE — H&P (Signed)
Cephas Darby, MD [address]  [place]  [place], [place] [address]  Main: [phone]  Fax: [phone] Pager: [phone]  Primary Care Physician:  Glean Hess, MD Primary Gastroenterologist:  Dr. Cephas Darby  Pre-Procedure History & Physical: HPI:  Saharrah Mizzi is a 52 y.o. female is here for an endoscopy and colonoscopy.   Past Medical History:  Diagnosis Date   Chronic bronchitis ([place])    History of thyroidectomy    right voice cord was removed during surgery   Ovarian cancer ([place])    ovarion   Pneumonia     Past Surgical History:  Procedure Laterality Date   CESAREAN SECTION     SHOULDER SURGERY Right    THYROIDECTOMY  2007   goiter   TOTAL VAGINAL HYSTERECTOMY  2000   ovarian cancer    Prior to Admission medications   Medication Sig Start Date End Date Taking? Authorizing Provider  albuterol (VENTOLIN HFA) 108 (90 Base) MCG/ACT inhaler Inhale 2 puffs into the lungs every 6 (six) hours as needed for wheezing or shortness of breath. 06/09/21  Yes Glean Hess, MD  levothyroxine (SYNTHROID) 100 MCG tablet Take 1 tablet (100 mcg total) by mouth daily before breakfast. 08/04/22  Yes Glean Hess, MD    Allergies as of 08/10/2022 - Review Complete 08/10/2022  Allergen Reaction Noted   Sulfamethoxazole-trimethoprim Shortness Of Breath 08/15/2007   Flagyl [metronidazole] Hives 04/12/2016    Family History  Adopted: Yes    Social History   Socioeconomic History   Marital status: Divorced    Spouse name: Not on file   Number of children: Not on file   Years of education: Not on file   Highest education level: Not on file  Occupational History    Comment: Health and safety inspector  Tobacco Use   Smoking status: Every Day    Packs/day: 0.50    Years: 30.00    Additional pack years: 0.00    Total pack years: 15.00    Types: Cigarettes    Last attempt to quit: 10/24/2018    Years since quitting: 3.8   Smokeless tobacco: Never   Vaping Use   Vaping Use: Never used  Substance and Sexual Activity   Alcohol use: Yes    Alcohol/week: 2.0 standard drinks of alcohol    Types: 2 Cans of beer per week    Comment: occ.   Drug use: No   Sexual activity: Not on file  Other Topics Concern   Not on file  Social History Narrative   Not on file   Social Determinants of Health   Financial Resource Strain: Low Risk  (07/04/2022)   Overall Financial Resource Strain (CARDIA)    Difficulty of Paying Living Expenses: Not hard at all  Food Insecurity: No Food Insecurity (07/04/2022)   Hunger Vital Sign    Worried About Running Out of Food in the Last Year: Never true    Ran Out of Food in the Last Year: Never true  Transportation Needs: No Transportation Needs (07/04/2022)   PRAPARE - Hydrologist (Medical): No    Lack of Transportation (Non-Medical): No  Physical Activity: Not on file  Stress: Not on file  Social Connections: Not on file  Intimate Partner Violence: Not At Risk (07/04/2022)   Humiliation, Afraid, Rape, and Kick questionnaire    Fear of Current or Ex-Partner: No    Emotionally Abused: No    Physically Abused: No  Sexually Abused: No    Review of Systems: See HPI, otherwise negative ROS  Physical Exam: BP (!) 140/87   Pulse 91   Temp 98.6 F (37 C) (Temporal)   Resp 13   Ht 5' (1.524 m)   Wt 64.9 kg   SpO2 98%   BMI 27.93 kg/m  General:   Alert,  pleasant and cooperative in NAD Head:  Normocephalic and atraumatic. Neck:  Supple; no masses or thyromegaly. Lungs:  Clear throughout to auscultation.    Heart:  Regular rate and rhythm. Abdomen:  Soft, nontender and nondistended. Normal bowel sounds, without guarding, and without rebound.   Neurologic:  Alert and  oriented x4;  grossly normal neurologically.  Impression/Plan: Wilmon Pali is here for an endoscopy and colonoscopy to be performed for chronic GERD, colon cancer screening  Risks, benefits,  limitations, and alternatives regarding  endoscopy and colonoscopy have been reviewed with the patient.  Questions have been answered.  All parties agreeable.   Sherri Sear, MD  09/01/2022, 11:16 AM

## 2022-09-01 NOTE — Transfer of Care (Signed)
Immediate Anesthesia Transfer of Care Note  Patient: Rachael Miller  Procedure(s) Performed: COLONOSCOPY WITH PROPOFOL (Rectum) ESOPHAGOGASTRODUODENOSCOPY (EGD) WITH PROPOFOL (Throat)  Patient Location: PACU  Anesthesia Type: General  Level of Consciousness: awake, alert  and patient cooperative  Airway and Oxygen Therapy: Patient Spontanous Breathing and Patient connected to supplemental oxygen  Post-op Assessment: Post-op Vital signs reviewed, Patient's Cardiovascular Status Stable, Respiratory Function Stable, Patent Airway and No signs of Nausea or vomiting  Post-op Vital Signs: Reviewed and stable  Complications: No notable events documented.

## 2022-09-01 NOTE — Anesthesia Preprocedure Evaluation (Signed)
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 Dental  (+) Dental Advidsory Given   Pulmonary neg shortness of breath, asthma , neg COPD, neg recent URI, Current Smoker   Pulmonary exam normal breath sounds clear to auscultation       Cardiovascular Exercise Tolerance: Good negative cardio ROS Normal cardiovascular exam Rhythm:regular Rate:Normal     Neuro/Psych negative neurological ROS  negative psych ROS   GI/Hepatic Neg liver ROS,GERD  ,,  Endo/Other  neg diabetesHypothyroidism    Renal/GU negative Renal ROS  negative genitourinary   Musculoskeletal   Abdominal   Peds  Hematology negative hematology ROS (+)   Anesthesia Other Findings Past Medical History: No date: Chronic bronchitis ([facility]) No date: History of thyroidectomy     Comment:  right voice cord was removed during surgery No date: Ovarian cancer ([place])     Comment:  ovarion No date: Pneumonia   Reproductive/Obstetrics negative OB ROS                             Anesthesia Physical Anesthesia Plan  ASA: 2  Anesthesia Plan: General   Post-op Pain Management:    Induction: Intravenous  PONV Risk Score and Plan: 3 and Propofol infusion, TIVA and Treatment may vary due to age or medical condition  Airway Management Planned: Natural Airway and Nasal Cannula  Additional Equipment:   Intra-op Plan:   Post-operative Plan:   Informed Consent: I have reviewed the patients History and Physical, chart, labs and discussed the  procedure including the risks, benefits and alternatives for the proposed anesthesia with the patient or authorized representative who has indicated his/her understanding and acceptance.     Dental Advisory Given  Plan Discussed with: Anesthesiologist, CRNA and Surgeon  Anesthesia Plan Comments:        Anesthesia Quick Evaluation

## 2022-09-01 NOTE — Anesthesia Postprocedure Evaluation (Signed)
Anesthesia Post Note  Patient: [place]  Procedure(s) Performed: COLONOSCOPY WITH PROPOFOL (Rectum) ESOPHAGOGASTRODUODENOSCOPY (EGD) WITH PROPOFOL (Throat)  Patient location during evaluation: PACU Anesthesia Type: General Level of consciousness: awake and alert Pain management: pain level controlled Vital Signs Assessment: post-procedure vital signs reviewed and stable Respiratory status: spontaneous breathing, nonlabored ventilation, respiratory function stable and patient connected to nasal cannula oxygen Cardiovascular status: blood pressure returned to baseline and stable Postop Assessment: no apparent nausea or vomiting Anesthetic complications: no   No notable events documented.   Last Vitals:  Vitals:   09/01/22 1137 09/01/22 1145  BP: 106/73 117/89  Pulse: 83 86  Resp: 14 14  Temp: 36.4 C 36.4 C  SpO2: 98% 98%    Last Pain:  Vitals:   09/01/22 1145  TempSrc:   PainSc: 0-No pain                 Martha Clan

## 2022-09-01 NOTE — Op Note (Signed)
[facility] Gastroenterology Patient Name: [place] Procedure Date: 09/01/2022 11:15 AM MRN: [id number] Account #: [id number] Date of Birth: 1970/11/22 Admit Type: Outpatient Age: 52 Room: [facility] OR ROOM 01 Gender: Female Note Status: Finalized Instrument Name: [id number] Procedure:             Colonoscopy Indications:           Screening for colorectal malignant neoplasm, This is                         the patient's first colonoscopy Providers:             Lin Landsman MD, MD Complications:         No immediate complications. Estimated blood loss: None. Procedure:             Pre-Anesthesia Assessment:                        - Prior to the procedure, a History and Physical was                         performed, and patient medications and allergies were                         reviewed. The patient is competent. The risks and                         benefits of the procedure and the sedation options and                         risks were discussed with the patient. All questions                         were answered and informed consent was obtained.                         Patient identification and proposed procedure were                         verified by the physician, the nurse, the                         anesthesiologist, the anesthetist and the technician                         in the pre-procedure area in the procedure room in the                         endoscopy suite. Mental Status Examination: alert and                         oriented. Airway Examination: normal oropharyngeal                         airway and neck mobility. Respiratory Examination:                         clear to auscultation. CV Examination: normal.  Prophylactic Antibiotics: The patient does not require                         prophylactic antibiotics. Prior Anticoagulants: The                         patient has taken no anticoagulant or  antiplatelet                         agents. ASA Grade Assessment: II - A patient with mild                         systemic disease. After reviewing the risks and                         benefits, the patient was deemed in satisfactory                         condition to undergo the procedure. The anesthesia                         plan was to use general anesthesia. Immediately prior                         to administration of medications, the patient was                         re-assessed for adequacy to receive sedatives. The                         heart rate, respiratory rate, oxygen saturations,                         blood pressure, adequacy of pulmonary ventilation, and                         response to care were monitored throughout the                         procedure. The physical status of the patient was                         re-assessed after the procedure.                        After obtaining informed consent, the colonoscope was                         passed under direct vision. Throughout the procedure,                         the patient's blood pressure, pulse, and oxygen                         saturations were monitored continuously. The was                         introduced through the anus with the intention of  advancing to the cecum. The scope was advanced to the                         sigmoid colon before the procedure was aborted.                         Medications were given. The colonoscopy was extremely                         difficult due to inadequate bowel prep. The patient                         tolerated the procedure well. The quality of the bowel                         preparation was poor. Findings:      The perianal and digital rectal examinations were normal. Pertinent       negatives include normal sphincter tone and no palpable rectal lesions.      Copious quantities of semi-solid stool was found in the  rectum, in the       recto-sigmoid colon and in the sigmoid colon, precluding visualization.      Procedure aborted      Recommed pediatric colonoscope as patient developed bradycardia from       vasovagal response dring sigmoid turn Impression:            - Preparation of the colon was poor.                        - Stool in the rectum, in the recto-sigmoid colon and                         in the sigmoid colon.                        - No specimens collected. Recommendation:        - Discharge patient to home (with escort).                        - Clear liquid diet today.                        - Continue present medications.                        - Repeat colonoscopy tomorrow with repeat prep if pt                         is agreeable because the bowel preparation was                         suboptimal otheriwse reschedule within next 3 months. Procedure Code(s):     --- Professional ---                        [id number], 52, Colorectal cancer screening; colonoscopy on                         individual not meeting criteria for high risk  Diagnosis Code(s):     --- Professional ---                        Z12.11, Encounter for screening for malignant neoplasm                         of colon CPT copyright 2022 [employer]. All rights reserved. The codes documented in this report are preliminary and upon coder review may  be revised to meet current compliance requirements. Dr. Ulyess Mort Lin Landsman MD, MD 09/01/2022 11:40:39 AM This report has been signed electronically. Number of Addenda: 0 Note Initiated On: 09/01/2022 11:15 AM Total Procedure Duration: 0 hours 3 minutes 58 seconds  Estimated Blood Loss:  Estimated blood loss: none.      [facility]

## 2022-09-01 NOTE — Op Note (Signed)
[facility] Gastroenterology Patient Name: Rachael Miller Procedure Date: 09/01/2022 11:15 AM MRN: [id number] Account #: [id number] Date of Birth: [birth year] Admit Type: Outpatient Age: 52 Room: [facility] OR ROOM 01 Gender: Female Note Status: Finalized Instrument Name: [id number] Procedure:             Upper GI endoscopy Indications:           Follow-up of gastro-esophageal reflux disease Providers:             Lin Landsman MD, MD Referring MD:          Halina Maidens, MD (Referring MD) Medicines:             General Anesthesia Complications:         No immediate complications. Estimated blood loss: None. Procedure:             Pre-Anesthesia Assessment:                        - Prior to the procedure, a History and Physical was                         performed, and patient medications and allergies were                         reviewed. The patient is competent. The risks and                         benefits of the procedure and the sedation options and                         risks were discussed with the patient. All questions                         were answered and informed consent was obtained.                         Patient identification and proposed procedure were                         verified by the physician, the nurse, the                         anesthesiologist, the anesthetist and the technician                         in the pre-procedure area in the procedure room in the                         endoscopy suite. Mental Status Examination: alert and                         oriented. Airway Examination: normal oropharyngeal                         airway and neck mobility. Respiratory Examination:                         clear to auscultation. CV Examination: normal.  Prophylactic Antibiotics: The patient does not require                         prophylactic antibiotics. Prior Anticoagulants: The                          patient has taken no anticoagulant or antiplatelet                         agents. ASA Grade Assessment: II - A patient with mild                         systemic disease. After reviewing the risks and                         benefits, the patient was deemed in satisfactory                         condition to undergo the procedure. The anesthesia                         plan was to use general anesthesia. Immediately prior                         to administration of medications, the patient was                         re-assessed for adequacy to receive sedatives. The                         heart rate, respiratory rate, oxygen saturations,                         blood pressure, adequacy of pulmonary ventilation, and                         response to care were monitored throughout the                         procedure. The physical status of the patient was                         re-assessed after the procedure.                        After obtaining informed consent, the endoscope was                         passed under direct vision. Throughout the procedure,                         the patient's blood pressure, pulse, and oxygen                         saturations were monitored continuously. The Endoscope                         was introduced through the mouth, and advanced to the  second part of duodenum. The upper GI endoscopy was                         accomplished without difficulty. The patient tolerated                         the procedure well. Findings:      The esophagus was normal.      The stomach was normal.      The examined duodenum was normal. Impression:            - Normal esophagus.                        - Normal stomach.                        - Normal examined duodenum.                        - No specimens collected. Recommendation:        - Follow an antireflux regimen.                        - Continue present medications.                         - Proceed with colonoscopy as scheduled                        See colonoscopy report Procedure Code(s):     --- Professional ---                        [phone], Esophagogastroduodenoscopy, flexible,                         transoral; diagnostic, including collection of                         specimen(s) by brushing or washing, when performed                         (separate procedure) Diagnosis Code(s):     --- Professional ---                        K21.9, Gastro-esophageal reflux disease without                         esophagitis CPT copyright 2022 American [employer] Association. All rights reserved. The codes documented in this report are preliminary and upon coder review may  be revised to meet current compliance requirements. Dr. Ulyess Mort Lin Landsman MD, MD 09/01/2022 11:29:53 AM This report has been signed electronically. Number of Addenda: 0 Note Initiated On: 09/01/2022 11:15 AM Total Procedure Duration: 0 hours 3 minutes 47 seconds  Estimated Blood Loss:  Estimated blood loss: none.      [facility]

## 2022-09-02 ENCOUNTER — Encounter
Admission: RE | Disposition: A | Discharge status: Home / Self Care | Payer: Self-pay | Source: Home / Self Care | Attending: Gastroenterology

## 2022-09-02 ENCOUNTER — Ambulatory Visit: Payer: BC Managed Care – PPO | Admitting: Certified Registered"

## 2022-09-02 ENCOUNTER — Ambulatory Visit
Admission: RE | Admit: 2022-09-02 | Discharge: 2022-09-02 | Disposition: A | Discharge status: Home / Self Care | Payer: BC Managed Care – PPO | Attending: Gastroenterology | Admitting: Gastroenterology

## 2022-09-02 DIAGNOSIS — Z1211 Encounter for screening for malignant neoplasm of colon: Secondary | ICD-10-CM | POA: Diagnosis not present

## 2022-09-02 DIAGNOSIS — F17210 Nicotine dependence, cigarettes, uncomplicated: Secondary | ICD-10-CM | POA: Diagnosis not present

## 2022-09-02 DIAGNOSIS — E890 Postprocedural hypothyroidism: Secondary | ICD-10-CM | POA: Insufficient documentation

## 2022-09-02 HISTORY — PX: COLONOSCOPY WITH PROPOFOL: SHX5780

## 2022-09-02 SURGERY — COLONOSCOPY WITH PROPOFOL
Anesthesia: General

## 2022-09-02 MED ORDER — LIDOCAINE 2% (20 MG/ML) 5 ML SYRINGE
INTRAMUSCULAR | Status: DC | PRN
  Administered 2022-09-02: 20 mg via INTRAVENOUS

## 2022-09-02 MED ORDER — GLYCOPYRROLATE 0.2 MG/ML IJ SOLN
INTRAMUSCULAR | Status: DC | PRN
  Administered 2022-09-02: .2 mg via INTRAVENOUS

## 2022-09-02 MED ORDER — MIDAZOLAM HCL 2 MG/2ML IJ SOLN
INTRAMUSCULAR | Status: AC
  Filled 2022-09-02: qty 2

## 2022-09-02 MED ORDER — SODIUM CHLORIDE 0.9 % IV SOLN
INTRAVENOUS | Status: DC
  Administered 2022-09-02: 20 mL/h via INTRAVENOUS

## 2022-09-02 MED ORDER — PROPOFOL 10 MG/ML IV BOLUS
INTRAVENOUS | Status: DC | PRN
  Administered 2022-09-02: 70 mg via INTRAVENOUS

## 2022-09-02 MED ORDER — MIDAZOLAM HCL 5 MG/5ML IJ SOLN
INTRAMUSCULAR | Status: DC | PRN
  Administered 2022-09-02: 2 mg via INTRAVENOUS

## 2022-09-02 MED ORDER — PROPOFOL 500 MG/50ML IV EMUL
INTRAVENOUS | Status: DC | PRN
  Administered 2022-09-02: 120 ug/kg/min via INTRAVENOUS

## 2022-09-02 NOTE — Anesthesia Preprocedure Evaluation (Signed)
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   Pulmonary exam normal breath sounds clear to auscultation       Cardiovascular Exercise Tolerance: Good negative cardio ROS Normal cardiovascular exam Rhythm:regular Rate:Normal     Neuro/Psych negative neurological ROS  negative psych ROS   GI/Hepatic Neg liver ROS,GERD  ,,  Endo/Other  neg diabetesHypothyroidism    Renal/GU negative Renal ROS  negative genitourinary   Musculoskeletal   Abdominal   Peds  Hematology negative hematology ROS (+)   Anesthesia Other Findings Past Medical History: No date: Chronic bronchitis (HCC) No date: History of thyroidectomy     Comment:  right voice cord was removed during surgery No date: Ovarian cancer ([place])     Comment:  ovarion No date: Pneumonia   Reproductive/Obstetrics negative OB ROS                              Anesthesia Physical Anesthesia Plan  ASA: 2  Anesthesia Plan: General   Post-op Pain Management:    Induction: Intravenous  PONV Risk Score and Plan: 3 and Propofol infusion, TIVA and Treatment may vary due to age or medical condition  Airway Management Planned: Natural Airway and Nasal Cannula  Additional Equipment:   Intra-op Plan:   Post-operative Plan:   Informed Consent: I have reviewed the patients History and  Physical, chart, labs and discussed the procedure including the risks, benefits and alternatives for the proposed anesthesia with the patient or authorized representative who has indicated his/her understanding and acceptance.     Dental Advisory Given  Plan Discussed with: Anesthesiologist, CRNA and Surgeon  Anesthesia Plan Comments:         Anesthesia Quick Evaluation

## 2022-09-02 NOTE — Transfer of Care (Signed)
Immediate Anesthesia Transfer of Care Note  Patient: Rachael Miller  Procedure(s) Performed: COLONOSCOPY WITH PROPOFOL  Patient Location: Endoscopy Unit  Anesthesia Type:General  Level of Consciousness: awake  Airway & Oxygen Therapy: Patient Spontanous Breathing  Post-op Assessment: Report given to RN and Post -op Vital signs reviewed and stable  Post vital signs: Reviewed  Last Vitals:  Vitals Value Taken Time  BP    Temp    Pulse    Resp    SpO2      Last Pain:  Vitals:   09/02/22 1116  TempSrc: Temporal  PainSc: 0-No pain         Complications: No notable events documented.

## 2022-09-02 NOTE — Op Note (Signed)
[facility] Gastroenterology Patient Name: [place] Procedure Date: 09/02/2022 11:44 AM MRN: [id number] Account #: [id number] Date of Birth: Jun 23, 1970 Admit Type: Outpatient Age: 52 Room: [facility] ENDO ROOM 2 Gender: Female Note Status: Finalized Instrument Name: Jasper Riling [id number] Procedure:             Colonoscopy Indications:           Screening for colorectal malignant neoplasm Providers:             Lin Landsman MD, MD Referring MD:          Halina Maidens, MD (Referring MD) Medicines:             General Anesthesia Complications:         No immediate complications. Estimated blood loss: None. Procedure:             Pre-Anesthesia Assessment:                        - Prior to the procedure, a History and Physical was                         performed, and patient medications and allergies were                         reviewed. The patient is competent. The risks and                         benefits of the procedure and the sedation options and                         risks were discussed with the patient. All questions                         were answered and informed consent was obtained.                         Patient identification and proposed procedure were                         verified by the physician, the nurse, the                         anesthesiologist, the anesthetist and the technician                         in the pre-procedure area in the procedure room in the                         endoscopy suite. Mental Status Examination: alert and                         oriented. Airway Examination: normal oropharyngeal                         airway and neck mobility. Respiratory Examination:                         clear to auscultation. CV Examination: normal.  Prophylactic Antibiotics: The patient does not require                         prophylactic antibiotics. Prior Anticoagulants: The                          patient has taken no anticoagulant or antiplatelet                         agents. ASA Grade Assessment: II - A patient with mild                         systemic disease. After reviewing the risks and                         benefits, the patient was deemed in satisfactory                         condition to undergo the procedure. The anesthesia                         plan was to use general anesthesia. Immediately prior                         to administration of medications, the patient was                         re-assessed for adequacy to receive sedatives. The                         heart rate, respiratory rate, oxygen saturations,                         blood pressure, adequacy of pulmonary ventilation, and                         response to care were monitored throughout the                         procedure. The physical status of the patient was                         re-assessed after the procedure.                        After obtaining informed consent, the colonoscope was                         passed under direct vision. Throughout the procedure,                         the patient's blood pressure, pulse, and oxygen                         saturations were monitored continuously. The                         Colonoscope was introduced through the anus and  advanced to the the cecum, identified by appendiceal                         orifice and ileocecal valve. The colonoscopy was                         unusually difficult due to restricted mobility of the                         colon and significant looping. Successful completion                         of the procedure was aided by applying abdominal                         pressure. The patient tolerated the procedure well.                         The ileocecal valve, appendiceal orifice, and rectum                         were photographed. The quality of the bowel                          preparation was evaluated using the BBPS [facility] Bowel                         Preparation Scale) with scores of: Right Colon = 2                         (minor amount of residual staining, small fragments of                         stool and/or opaque liquid, but mucosa seen well),                         Transverse Colon = 3 (entire mucosa seen well with no                         residual staining, small fragments of stool or opaque                         liquid) and Left Colon = 3 (entire mucosa seen well                         with no residual staining, small fragments of stool or                         opaque liquid). The total BBPS score equals 8. The                         quality of the bowel preparation was adequate to                         identify polyps greater than 5 mm in size. Findings:      Pediatric colonoscope was used  The perianal and digital rectal examinations were normal. Pertinent       negatives include normal sphincter tone and no palpable rectal lesions.      The entire examined colon appeared normal.      The retroflexed view of the distal rectum and anal verge was normal and       showed no anal or rectal abnormalities. Impression:            - The entire examined colon is normal.                        - The distal rectum and anal verge are normal on                         retroflexion view.                        - No specimens collected. Recommendation:        - Discharge patient to home (with escort).                        - Resume previous diet today.                        - Continue present medications.                        - Repeat colonoscopy in 5 years with 2 day prep for                         screening purposes due to fair prep in right colon. Procedure Code(s):     --- Professional ---                        [id number], Colorectal cancer screening; colonoscopy on                         individual not meeting criteria for high  risk Diagnosis Code(s):     --- Professional ---                        Z12.11, Encounter for screening for malignant neoplasm                         of colon CPT copyright 2022 [employer]. All rights reserved. The codes documented in this report are preliminary and upon coder review may  be revised to meet current compliance requirements. Dr. Ulyess Mort Lin Landsman MD, MD 09/02/2022 12:16:02 PM This report has been signed electronically. Number of Addenda: 0 Note Initiated On: 09/02/2022 11:44 AM Scope Withdrawal Time: 0 hours 8 minutes 15 seconds  Total Procedure Duration: 0 hours 15 minutes 49 seconds  Estimated Blood Loss:  Estimated blood loss: none. Estimated blood loss: none.      [facility]

## 2022-09-02 NOTE — H&P (Signed)
Cephas Darby, MD [address]  [place]  [place],  [address]  Main: [phone]  Fax: [phone] Pager: [phone]  Primary Care Physician:  Glean Hess, MD Primary Gastroenterologist:  Dr. Cephas Darby  Pre-Procedure History & Physical: HPI:  Rachael Miller is a 52 y.o. female is here for an colonoscopy.   Past Medical History:  Diagnosis Date   Chronic bronchitis ([place])    History of thyroidectomy    right voice cord was removed during surgery   Ovarian cancer ([place])    ovarion   Pneumonia     Past Surgical History:  Procedure Laterality Date   ABDOMINAL HYSTERECTOMY     CESAREAN SECTION     COLONOSCOPY WITH PROPOFOL N/A 09/01/2022   Procedure: COLONOSCOPY WITH PROPOFOL;  Surgeon: Lin Landsman, MD;  Location: [facility];  Service: Endoscopy;  Laterality: N/A;   ESOPHAGOGASTRODUODENOSCOPY (EGD) WITH PROPOFOL N/A 09/01/2022   Procedure: ESOPHAGOGASTRODUODENOSCOPY (EGD) WITH PROPOFOL;  Surgeon: Lin Landsman, MD;  Location: [facility];  Service: Endoscopy;  Laterality: N/A;   SHOULDER SURGERY Right    THYROIDECTOMY  2007   goiter   TOTAL VAGINAL HYSTERECTOMY  2000   ovarian cancer    Prior to Admission medications   Medication Sig Start Date End Date Taking? Authorizing Provider  albuterol (VENTOLIN HFA) 108 (90 Base) MCG/ACT inhaler Inhale 2 puffs into the lungs every 6 (six) hours as needed for wheezing or shortness of breath. 06/09/21   Glean Hess, MD  levothyroxine (SYNTHROID) 100 MCG tablet Take 1 tablet (100 mcg total) by mouth daily before breakfast. 08/04/22   Glean Hess, MD    Allergies as of 09/01/2022 - Review Complete 09/01/2022  Allergen Reaction Noted   Sulfamethoxazole-trimethoprim Shortness Of Breath 08/15/2007   Flagyl [metronidazole] Hives 04/12/2016    Family History  Adopted: Yes    Social History   Socioeconomic History   Marital status: Divorced    Spouse name: Not  on file   Number of children: Not on file   Years of education: Not on file   Highest education level: Not on file  Occupational History    Comment: Health and safety inspector  Tobacco Use   Smoking status: Every Day    Packs/day: 0.50    Years: 30.00    Additional pack years: 0.00    Total pack years: 15.00    Types: Cigarettes    Last attempt to quit: 10/24/2018    Years since quitting: 3.8   Smokeless tobacco: Never  Vaping Use   Vaping Use: Never used  Substance and Sexual Activity   Alcohol use: Yes    Alcohol/week: 2.0 standard drinks of alcohol    Types: 2 Cans of beer per week    Comment: occ.   Drug use: No   Sexual activity: Not on file  Other Topics Concern   Not on file  Social History Narrative   Not on file   Social Determinants of Health   Financial Resource Strain: Low Risk  (07/04/2022)   Overall Financial Resource Strain (CARDIA)    Difficulty of Paying Living Expenses: Not hard at all  Food Insecurity: No Food Insecurity (07/04/2022)   Hunger Vital Sign    Worried About Running Out of Food in the Last Year: Never true    Ran Out of Food in the Last Year: Never true  Transportation Needs: No Transportation Needs (07/04/2022)   PRAPARE - Transportation    Lack of  Transportation (Medical): No    Lack of Transportation (Non-Medical): No  Physical Activity: Not on file  Stress: Not on file  Social Connections: Not on file  Intimate Partner Violence: Not At Risk (07/04/2022)   Humiliation, Afraid, Rape, and Kick questionnaire    Fear of Current or Ex-Partner: No    Emotionally Abused: No    Physically Abused: No    Sexually Abused: No    Review of Systems: See HPI, otherwise negative ROS  Physical Exam: BP (!) 123/98   Pulse 95   Temp (!) 96.4 F (35.8 C) (Temporal)   Resp 20   Ht 5' (1.524 m)   Wt 64.5 kg   SpO2 99%   BMI 27.77 kg/m  General:   Alert,  pleasant and cooperative in NAD Head:  Normocephalic and atraumatic. Neck:  Supple; no masses or  thyromegaly. Lungs:  Clear throughout to auscultation.    Heart:  Regular rate and rhythm. Abdomen:  Soft, nontender and nondistended. Normal bowel sounds, without guarding, and without rebound.   Neurologic:  Alert and  oriented x4;  grossly normal neurologically.  Impression/Plan: Rachael Miller is here for an colonoscopy to be performed for colon cancer screening  Risks, benefits, limitations, and alternatives regarding  colonoscopy have been reviewed with the patient.  Questions have been answered.  All parties agreeable.   Sherri Sear, MD  09/02/2022, 11:47 AM

## 2022-09-02 NOTE — Anesthesia Postprocedure Evaluation (Signed)
Anesthesia Post Note  Patient: [place]  Procedure(s) Performed: COLONOSCOPY WITH PROPOFOL  Patient location during evaluation: Endoscopy Anesthesia Type: General Level of consciousness: awake and alert Pain management: pain level controlled Vital Signs Assessment: post-procedure vital signs reviewed and stable Respiratory status: spontaneous breathing, nonlabored ventilation, respiratory function stable and patient connected to nasal cannula oxygen Cardiovascular status: blood pressure returned to baseline and stable Postop Assessment: no apparent nausea or vomiting Anesthetic complications: no   No notable events documented.   Last Vitals:  Vitals:   09/02/22 1116 09/02/22 1216  BP: (!) 123/98 103/64  Pulse: 95 73  Resp: 20 20  Temp: (!) 35.8 C 36.8 C  SpO2: 99% 99%    Last Pain:  Vitals:   09/02/22 1216  TempSrc: Temporal  PainSc: Asleep                 Ilene Qua

## 2022-09-05 ENCOUNTER — Encounter: Payer: Self-pay | Admitting: Gastroenterology

## 2022-11-28 ENCOUNTER — Telehealth (INDEPENDENT_AMBULATORY_CARE_PROVIDER_SITE_OTHER): Payer: BC Managed Care – PPO | Admitting: Internal Medicine

## 2022-11-28 ENCOUNTER — Encounter: Payer: Self-pay | Admitting: Internal Medicine

## 2022-11-28 VITALS — Ht 60.0 in | Wt 142.2 lb

## 2022-11-28 DIAGNOSIS — R051 Acute cough: Secondary | ICD-10-CM

## 2022-11-28 DIAGNOSIS — J0100 Acute maxillary sinusitis, unspecified: Secondary | ICD-10-CM | POA: Diagnosis not present

## 2022-11-28 MED ORDER — PROMETHAZINE-DM 6.25-15 MG/5ML PO SYRP: 5.0000 mL | ORAL_SOLUTION | Freq: Four times a day (QID) | ORAL | 0 refills | Status: AC | PRN

## 2022-11-28 MED ORDER — AMOXICILLIN-POT CLAVULANATE 875-125 MG PO TABS: 1.0000 | ORAL_TABLET | Freq: Two times a day (BID) | ORAL | 0 refills | Status: AC

## 2022-11-28 NOTE — Progress Notes (Signed)
Date:  11/28/2022   Name:  Rachael Miller   DOB:  23-Oct-1970   MRN:  [id number]  This encounter was conducted via video encounter. This platform was deemed appropriate for the issues to be addressed.  The patient was correctly identified.  I advised that I am conducting the visit from a secure room in my office at [facility].  The patient is located home. The limitations of this form of encounter were discussed with the patient and he/she agreed to proceed.  Some vital signs will be absent.  Chief Complaint: Sinusitis  Sinusitis This is a new problem. The current episode started in the past 7 days. The problem has been waxing and waning since onset. There has been no fever. Associated symptoms include congestion, coughing, ear pain, headaches, sinus pressure (green sinus drainage) and a sore throat. Pertinent negatives include no shortness of breath. Past treatments include acetaminophen and saline nose sprays. The treatment provided no relief.    Lab Results  Component Value Date   NA 142 08/03/2022   K 4.0 08/03/2022   CO2 21 08/03/2022   GLUCOSE 108 (H) 08/03/2022   BUN 19 08/03/2022   CREATININE 1.02 (H) 08/03/2022   CALCIUM 9.2 08/03/2022   EGFR 66 08/03/2022   GFRNONAA >60 09/03/2019   No results found for: "CHOL", "HDL", "LDLCALC", "LDLDIRECT", "TRIG", "CHOLHDL" Lab Results  Component Value Date   TSH 0.363 (L) 08/03/2022   No results found for: "HGBA1C" Lab Results  Component Value Date   WBC 7.8 08/03/2022   HGB 14.6 08/03/2022   HCT 42.8 08/03/2022   MCV 96 08/03/2022   PLT 267 08/03/2022   Lab Results  Component Value Date   ALT 34 (H) 08/03/2022   AST 22 08/03/2022   ALKPHOS 82 08/03/2022   BILITOT <0.2 08/03/2022   No results found for: "25OHVITD2", "25OHVITD3", "VD25OH"   Review of Systems  Constitutional:  Negative for appetite change, fatigue and fever.  HENT:  Positive for congestion, ear pain, sinus pressure (green sinus  drainage) and sore throat.   Respiratory:  Positive for cough. Negative for shortness of breath and wheezing.   Neurological:  Positive for headaches.    Patient Active Problem List   Diagnosis Date Noted   Chronic GERD 09/01/2022   Colon cancer screening 09/01/2022   Gastroesophageal reflux disease 03/01/2021   Low back pain with sciatica 03/23/2018   Hx of ovarian cancer 04/12/2016   Tobacco use disorder 04/12/2016   Postoperative hypothyroidism 04/12/2016   Asthma in adult, mild intermittent, uncomplicated 04/03/2016    Allergies  Allergen Reactions   Sulfamethoxazole-Trimethoprim Shortness Of Breath   Flagyl [Metronidazole] Hives    Past Surgical History:  Procedure Laterality Date   ABDOMINAL HYSTERECTOMY     CESAREAN SECTION     COLONOSCOPY WITH PROPOFOL N/A 09/01/2022   Procedure: COLONOSCOPY WITH PROPOFOL;  Surgeon: Toney Reil, MD;  Location: [facility];  Service: Endoscopy;  Laterality: N/A;   COLONOSCOPY WITH PROPOFOL N/A 09/02/2022   Procedure: COLONOSCOPY WITH PROPOFOL;  Surgeon: Toney Reil, MD;  Location: [facility];  Service: Gastroenterology;  Laterality: N/A;   ESOPHAGOGASTRODUODENOSCOPY (EGD) WITH PROPOFOL N/A 09/01/2022   Procedure: ESOPHAGOGASTRODUODENOSCOPY (EGD) WITH PROPOFOL;  Surgeon: Toney Reil, MD;  Location: [facility];  Service: Endoscopy;  Laterality: N/A;   SHOULDER SURGERY Right    THYROIDECTOMY  2007   goiter   TOTAL VAGINAL HYSTERECTOMY  2000   ovarian cancer  Social History   Tobacco Use   Smoking status: Every Day    Packs/day: 0.50    Years: 30.00    Additional pack years: 0.00    Total pack years: 15.00    Types: Cigarettes    Last attempt to quit: 10/24/2018    Years since quitting: 4.0   Smokeless tobacco: Never  Vaping Use   Vaping Use: Never used  Substance Use Topics   Alcohol use: Yes    Alcohol/week: 2.0 standard drinks of alcohol    Types: 2 Cans of beer per week     Comment: occ.   Drug use: No     Medication list has been reviewed and updated.  Current Meds  Medication Sig   albuterol (VENTOLIN HFA) 108 (90 Base) MCG/ACT inhaler Inhale 2 puffs into the lungs every 6 (six) hours as needed for wheezing or shortness of breath.   amoxicillin-clavulanate (AUGMENTIN) 875-125 MG tablet Take 1 tablet by mouth 2 (two) times daily for 10 days.   levothyroxine (SYNTHROID) 100 MCG tablet Take 1 tablet (100 mcg total) by mouth daily before breakfast.   promethazine-dextromethorphan (PROMETHAZINE-DM) 6.25-15 MG/5ML syrup Take 5 mLs by mouth 4 (four) times daily as needed for up to 9 days for cough.       11/28/2022   11:12 AM 08/03/2022    3:45 PM 07/04/2022   10:55 AM 06/09/2021    2:50 PM  GAD 7 : Generalized Anxiety Score  Nervous, Anxious, on Edge 0 0 0 0  Control/stop worrying 0 0 0 0  Worry too much - different things 0 0 0 0  Trouble relaxing 0 0 0 0  Restless 0 0 0 0  Easily annoyed or irritable 0 0 0 0  Afraid - awful might happen 0 0 0 0  Total GAD 7 Score 0 0 0 0  Anxiety Difficulty Not difficult at all Not difficult at all Not difficult at all        11/28/2022   11:12 AM 08/03/2022    3:44 PM 07/04/2022   10:55 AM  Depression screen PHQ 2/9  Decreased Interest 0 0 0  Down, Depressed, Hopeless 0 0 0  PHQ - 2 Score 0 0 0  Altered sleeping 0 0 1  Tired, decreased energy 0 0 2  Change in appetite 0 0 0  Feeling bad or failure about yourself  0 0 0  Trouble concentrating 0 0 0  Moving slowly or fidgety/restless 0 0 0  Suicidal thoughts 0 0 0  PHQ-9 Score 0 0 3  Difficult doing work/chores Not difficult at all Not difficult at all     BP Readings from Last 3 Encounters:  09/02/22 121/83  09/01/22 117/89  08/10/22 (!) 142/87    Physical Exam Constitutional:      Appearance: Normal appearance.  HENT:     Right Ear: Hearing normal.     Left Ear: Hearing normal.     Nose:     Right Sinus: Maxillary sinus tenderness present.      Left Sinus: Maxillary sinus tenderness present.     Comments: Painful when patient palpates the maxillary sinuses. Pulmonary:     Effort: Pulmonary effort is normal.     Breath sounds: No stridor.  Neurological:     Mental Status: She is alert.  Psychiatric:        Mood and Affect: Mood normal.        Behavior: Behavior normal.     Wt  Readings from Last 3 Encounters:  11/28/22 142 lb 3.2 oz (64.5 kg)  09/02/22 142 lb 3.2 oz (64.5 kg)  09/01/22 143 lb (64.9 kg)    Ht 5' (1.524 m)   Wt 142 lb 3.2 oz (64.5 kg)   BMI 27.77 kg/m   Assessment and Plan:  Problem List Items Addressed This Visit   None Visit Diagnoses     Acute non-recurrent maxillary sinusitis    -  Primary   continue fluids, Advil Sinus and saline NS   Relevant Medications   amoxicillin-clavulanate (AUGMENTIN) 875-125 MG tablet   promethazine-dextromethorphan (PROMETHAZINE-DM) 6.25-15 MG/5ML syrup   Acute cough       most likely due to PND can [place] cough syrup at bedtime to reduce drainage and mucus production   Relevant Medications   promethazine-dextromethorphan (PROMETHAZINE-DM) 6.25-15 MG/5ML syrup      I spent 6 minutes on this encounter, 100% of the time via video.  No follow-ups on file.   Partially dictated using Dragon software, any errors are not intentional.  Reubin Milan, MD [facility] [place], [place]

## 2023-01-30 ENCOUNTER — Other Ambulatory Visit: Payer: Self-pay | Admitting: Internal Medicine

## 2023-01-30 DIAGNOSIS — E890 Postprocedural hypothyroidism: Secondary | ICD-10-CM

## 2023-01-31 NOTE — Telephone Encounter (Signed)
Requested Prescriptions  Pending Prescriptions Disp Refills   levothyroxine (SYNTHROID) 100 MCG tablet [Pharmacy Med Name: LEVOTHYROXINE 100 MCG TABLET] 90 tablet 1    Sig: TAKE 1 TABLET BY MOUTH DAILY BEFORE BREAKFAST.     Endocrinology:  Hypothyroid Agents Failed - 01/30/2023  1:26 AM      Failed - TSH in normal range and within 360 days    TSH  Date Value Ref Range Status  08/03/2022 0.363 (L) 0.450 - 4.500 uIU/mL Final         Passed - Valid encounter within last 12 months    Recent Outpatient Visits           2 months ago Acute non-recurrent maxillary sinusitis   [place] [facility] at [facility], Nyoka Cowden, MD   6 months ago Postoperative hypothyroidism   [place] [facility] at [facility], Nyoka Cowden, MD   7 months ago Acute non-recurrent maxillary sinusitis   [place] [facility] at [facility], Nyoka Cowden, MD   1 year ago Community acquired pneumonia of right lower lobe of lung   [facility] at [facility], Nyoka Cowden, MD   1 year ago Postoperative hypothyroidism   [facility] at [facility], Nyoka Cowden, MD

## 2023-04-27 ENCOUNTER — Other Ambulatory Visit: Payer: Self-pay | Admitting: Internal Medicine

## 2023-04-27 DIAGNOSIS — Z1231 Encounter for screening mammogram for malignant neoplasm of breast: Secondary | ICD-10-CM

## 2023-04-28 ENCOUNTER — Ambulatory Visit
Admission: RE | Admit: 2023-04-28 | Discharge: 2023-04-28 | Disposition: A | Discharge status: Home / Self Care | Payer: BC Managed Care – PPO | Source: Ambulatory Visit | Attending: Internal Medicine | Admitting: Internal Medicine

## 2023-04-28 DIAGNOSIS — Z1231 Encounter for screening mammogram for malignant neoplasm of breast: Secondary | ICD-10-CM | POA: Diagnosis not present

## 2023-06-05 ENCOUNTER — Ambulatory Visit: Payer: BC Managed Care – PPO | Admitting: Internal Medicine

## 2023-06-05 VITALS — BP 124/78 | HR 90 | Temp 98.0°F | Ht 60.0 in | Wt 148.0 lb

## 2023-06-05 DIAGNOSIS — F17210 Nicotine dependence, cigarettes, uncomplicated: Secondary | ICD-10-CM

## 2023-06-05 DIAGNOSIS — J40 Bronchitis, not specified as acute or chronic: Secondary | ICD-10-CM | POA: Diagnosis not present

## 2023-06-05 MED ORDER — PREDNISONE 10 MG PO TABS: ORAL_TABLET | ORAL | 0 refills | Status: AC

## 2023-06-05 MED ORDER — DOXYCYCLINE HYCLATE 100 MG PO TABS: 100.0000 mg | ORAL_TABLET | Freq: Two times a day (BID) | ORAL | 0 refills | Status: AC

## 2023-06-05 MED ORDER — PROMETHAZINE-DM 6.25-15 MG/5ML PO SYRP: 5.0000 mL | ORAL_SOLUTION | Freq: Four times a day (QID) | ORAL | 0 refills | Status: AC | PRN

## 2023-06-05 NOTE — Progress Notes (Signed)
Date:  06/05/2023   Name:  Rachael Miller   DOB:  Sep 10, 1970   MRN:  [id number]   Chief Complaint: Cough (Non productive cough. Sore throat. Sore chest from coughing. Post nasal drip. Headache. Ear pain bilaterally. Not sleeping well due to cough. X 11 days.)  Sore Throat  This is a new problem. The current episode started in the past 7 days. There has been no fever. The pain is mild. Associated symptoms include congestion, coughing, ear pain, headaches and a hoarse voice.  Cough This is a new problem. The current episode started in the past 7 days. The cough is Productive of sputum. Associated symptoms include chest pain, ear pain, headaches, nasal congestion, postnasal drip, a sore throat and wheezing. Pertinent negatives include no chills or fever.    Review of Systems  Constitutional:  Positive for fatigue. Negative for chills and fever.  HENT:  Positive for congestion, ear pain, hoarse voice, postnasal drip, sinus pressure and sore throat.   Eyes:  Negative for visual disturbance.  Respiratory:  Positive for cough and wheezing.   Cardiovascular:  Positive for chest pain. Negative for palpitations.  Neurological:  Positive for headaches. Negative for dizziness.     Lab Results  Component Value Date   NA 142 08/03/2022   K 4.0 08/03/2022   CO2 21 08/03/2022   GLUCOSE 108 (H) 08/03/2022   BUN 19 08/03/2022   CREATININE 1.02 (H) 08/03/2022   CALCIUM 9.2 08/03/2022   EGFR 66 08/03/2022   GFRNONAA >60 09/03/2019   No results found for: "CHOL", "HDL", "LDLCALC", "LDLDIRECT", "TRIG", "CHOLHDL" Lab Results  Component Value Date   TSH 0.363 (L) 08/03/2022   No results found for: "HGBA1C" Lab Results  Component Value Date   WBC 7.8 08/03/2022   HGB 14.6 08/03/2022   HCT 42.8 08/03/2022   MCV 96 08/03/2022   PLT 267 08/03/2022   Lab Results  Component Value Date   ALT 34 (H) 08/03/2022   AST 22 08/03/2022   ALKPHOS 82 08/03/2022   BILITOT <0.2 08/03/2022    No results found for: "25OHVITD2", "25OHVITD3", "VD25OH"   Patient Active Problem List   Diagnosis Date Noted   Chronic GERD 09/01/2022   Colon cancer screening 09/01/2022   Gastroesophageal reflux disease 03/01/2021   Low back pain with sciatica 03/23/2018   Hx of ovarian cancer 04/12/2016   Tobacco use disorder 04/12/2016   Postoperative hypothyroidism 04/12/2016   Asthma in adult, mild intermittent, uncomplicated 04/03/2016    Allergies  Allergen Reactions   Sulfamethoxazole-Trimethoprim Shortness Of Breath   Flagyl [Metronidazole] Hives    Past Surgical History:  Procedure Laterality Date   ABDOMINAL HYSTERECTOMY     CESAREAN SECTION     COLONOSCOPY WITH PROPOFOL N/A 09/01/2022   Procedure: COLONOSCOPY WITH PROPOFOL;  Surgeon: Toney Reil, MD;  Location: [facility];  Service: Endoscopy;  Laterality: N/A;   COLONOSCOPY WITH PROPOFOL N/A 09/02/2022   Procedure: COLONOSCOPY WITH PROPOFOL;  Surgeon: Toney Reil, MD;  Location: [facility];  Service: Gastroenterology;  Laterality: N/A;   ESOPHAGOGASTRODUODENOSCOPY (EGD) WITH PROPOFOL N/A 09/01/2022   Procedure: ESOPHAGOGASTRODUODENOSCOPY (EGD) WITH PROPOFOL;  Surgeon: Toney Reil, MD;  Location: [facility];  Service: Endoscopy;  Laterality: N/A;   SHOULDER SURGERY Right    THYROIDECTOMY  2007   goiter   TOTAL VAGINAL HYSTERECTOMY  2000   ovarian cancer    Social History   Tobacco Use   Smoking status: Every Day  Current packs/day: 0.00    Average packs/day: 0.5 packs/day for 30.0 years (15.0 ttl pk-yrs)    Types: Cigarettes    Start date: 10/23/1988    Last attempt to quit: 10/24/2018    Years since quitting: 4.6   Smokeless tobacco: Never  Vaping Use   Vaping status: Never Used  Substance Use Topics   Alcohol use: Yes    Alcohol/week: 2.0 standard drinks of alcohol    Types: 2 Cans of beer per week    Comment: occ.   Drug use: No     Medication list has been  reviewed and updated.  Current Meds  Medication Sig   albuterol (VENTOLIN HFA) 108 (90 Base) MCG/ACT inhaler Inhale 2 puffs into the lungs every 6 (six) hours as needed for wheezing or shortness of breath.   doxycycline (VIBRA-TABS) 100 MG tablet Take 1 tablet (100 mg total) by mouth 2 (two) times daily for 10 days.   levothyroxine (SYNTHROID) 100 MCG tablet TAKE 1 TABLET BY MOUTH DAILY BEFORE BREAKFAST.   predniSONE (DELTASONE) 10 MG tablet Take 6 tablets (60 mg total) by mouth daily with breakfast for 1 day, THEN 5 tablets (50 mg total) daily with breakfast for 1 day, THEN 4 tablets (40 mg total) daily with breakfast for 1 day, THEN 3 tablets (30 mg total) daily with breakfast for 1 day, THEN 2 tablets (20 mg total) daily with breakfast for 1 day, THEN 1 tablet (10 mg total) daily with breakfast for 1 day.   promethazine-dextromethorphan (PROMETHAZINE-DM) 6.25-15 MG/5ML syrup Take 5 mLs by mouth 4 (four) times daily as needed for up to 9 days for cough.       06/05/2023    1:44 PM 11/28/2022   11:12 AM 08/03/2022    3:45 PM 07/04/2022   10:55 AM  GAD 7 : Generalized Anxiety Score  Nervous, Anxious, on Edge 0 0 0 0  Control/stop worrying 0 0 0 0  Worry too much - different things 0 0 0 0  Trouble relaxing 0 0 0 0  Restless 0 0 0 0  Easily annoyed or irritable 0 0 0 0  Afraid - awful might happen 0 0 0 0  Total GAD 7 Score 0 0 0 0  Anxiety Difficulty Not difficult at all Not difficult at all Not difficult at all Not difficult at all       06/05/2023    1:43 PM 11/28/2022   11:12 AM 08/03/2022    3:44 PM  Depression screen PHQ 2/9  Decreased Interest 0 0 0  Down, Depressed, Hopeless 0 0 0  PHQ - 2 Score 0 0 0  Altered sleeping 0 0 0  Tired, decreased energy 3 0 0  Change in appetite 1 0 0  Feeling bad or failure about yourself  0 0 0  Trouble concentrating 0 0 0  Moving slowly or fidgety/restless 0 0 0  Suicidal thoughts 0 0 0  PHQ-9 Score 4 0 0  Difficult doing work/chores  Not difficult at all Not difficult at all Not difficult at all    BP Readings from Last 3 Encounters:  06/05/23 124/78  09/02/22 121/83  09/01/22 117/89    Physical Exam Constitutional:      Appearance: Normal appearance. She is well-developed.  HENT:     Right Ear: Ear canal and external ear normal. Tympanic membrane is retracted. Tympanic membrane is not erythematous.     Left Ear: Ear canal and external ear normal. Tympanic membrane  is retracted. Tympanic membrane is not erythematous.     Nose:     Right Sinus: Maxillary sinus tenderness and frontal sinus tenderness present.     Left Sinus: Maxillary sinus tenderness and frontal sinus tenderness present.     Mouth/Throat:     Mouth: No oral lesions.     Pharynx: Uvula midline. Posterior oropharyngeal erythema present. No oropharyngeal exudate.  Cardiovascular:     Rate and Rhythm: Normal rate and regular rhythm.     Heart sounds: Normal heart sounds.  Pulmonary:     Breath sounds: Decreased breath sounds present. No wheezing, rhonchi or rales.  Lymphadenopathy:     Cervical: No cervical adenopathy.  Neurological:     Mental Status: She is alert and oriented to person, place, and time.     Wt Readings from Last 3 Encounters:  06/05/23 148 lb (67.1 kg)  11/28/22 142 lb 3.2 oz (64.5 kg)  09/02/22 142 lb 3.2 oz (64.5 kg)    BP 124/78   Pulse 90   Temp 98 F (36.7 C) (Oral)   Ht 5' (1.524 m)   Wt 148 lb (67.1 kg)   SpO2 97%   BMI 28.90 kg/m   Assessment and Plan:  Problem List Items Addressed This Visit   None Visit Diagnoses       Bronchitis    -  Primary   continue fluids, Mucinex, rest work excuse given   Relevant Medications   predniSONE (DELTASONE) 10 MG tablet   doxycycline (VIBRA-TABS) 100 MG tablet   promethazine-dextromethorphan (PROMETHAZINE-DM) 6.25-15 MG/5ML syrup       No follow-ups on file.    Reubin Milan, MD [facility] [place]

## 2023-06-27 ENCOUNTER — Encounter: Payer: Self-pay | Admitting: Internal Medicine

## 2023-07-01 IMAGING — MR MR LUMBAR SPINE W/O CM
5 series · 31 of 48 positions shown · non-contrast
Comparison: None.

CLINICAL DATA: Low back pain, left leg pain extending to the foot
for 3 weeks.

EXAM:
MRI LUMBAR SPINE WITHOUT CONTRAST
TECHNIQUE: Multiplanar, multisequence MR imaging of the lumbar spine was
performed. No intravenous contrast was administered.

[Series 5: T2 · sagittal · 4.0mm · 0.81mm/px · 6 of 13 slices shown (1 of 2)]
[im 1/13]
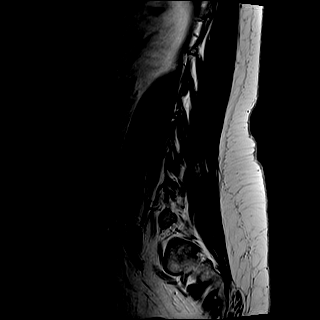
[im 3/13]
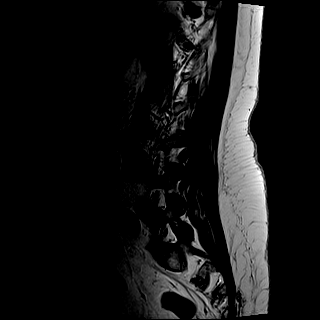
[im 5/13]
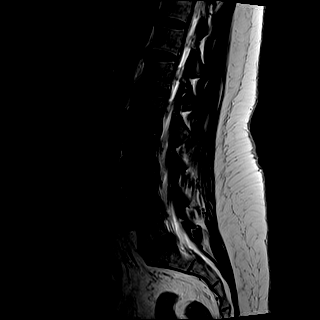
[im 8/13]
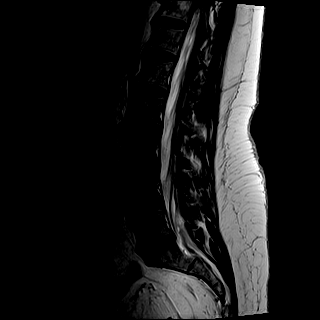
[im 10/13]
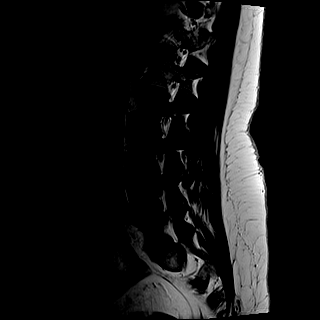
[im 13/13]
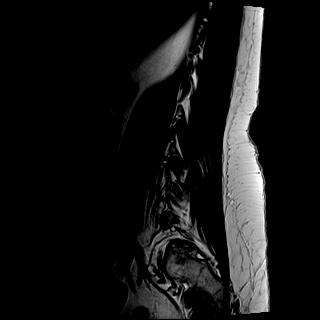

[Series 6: T1 · sagittal · 4.0mm · 0.81mm/px · 6 of 13 slices shown (1 of 2)]
[im 1/13]
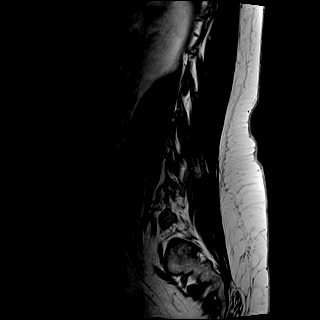
[im 3/13]
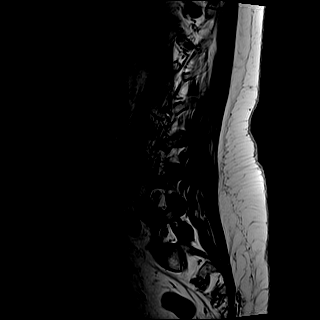
[im 5/13]
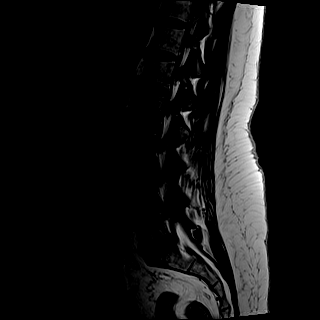
[im 8/13]
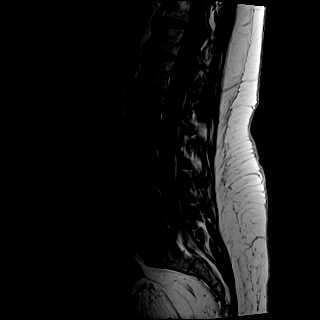
[im 10/13]
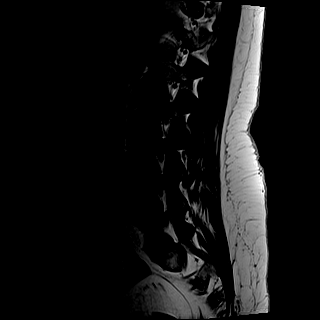
[im 13/13]
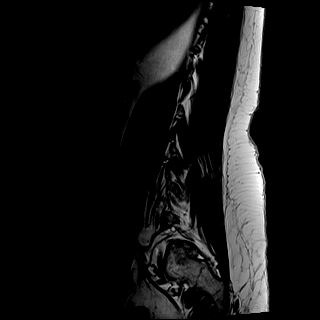

[Series 7: STIR · sagittal · 4.0mm · 0.41mm/px · 1 of 13 slices shown]
[im 1/13]
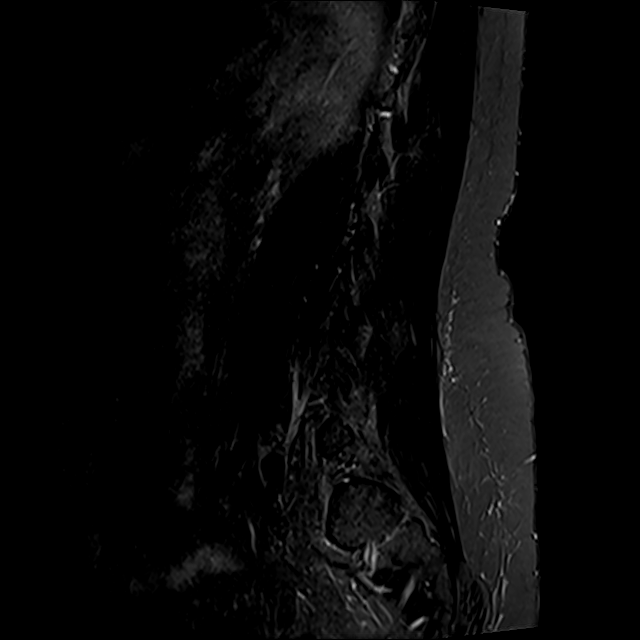

[Series 8: T2 · axial · 4.0mm · 0.78mm/px · z∈[-42,+156]mm · 9 of 32 slices shown (2 of 2)]
[im 1/32]
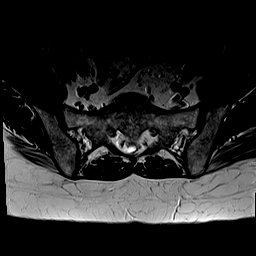
[im 5/32]
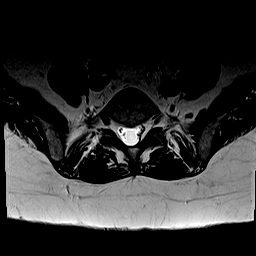
[im 9/32]
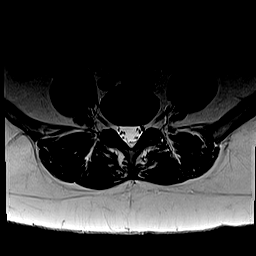
[im 14/32]
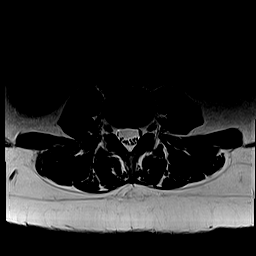
[im 16/32]
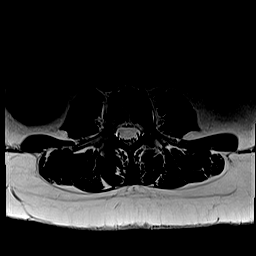
[im 18/32]
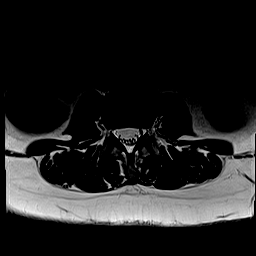
[im 23/32]
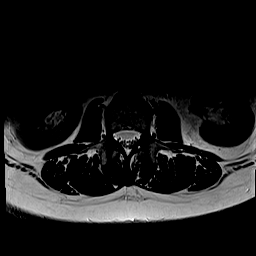
[im 27/32]
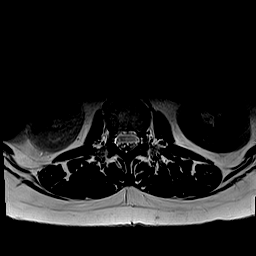
[im 32/32]
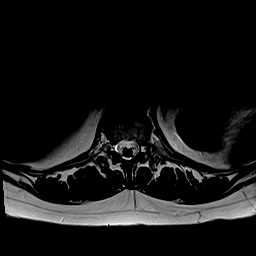

[Series 9: T1 · axial · 4.0mm · 0.39mm/px · z∈[-42,+156]mm · 9 of 32 slices shown (2 of 2)]
[im 1/32]
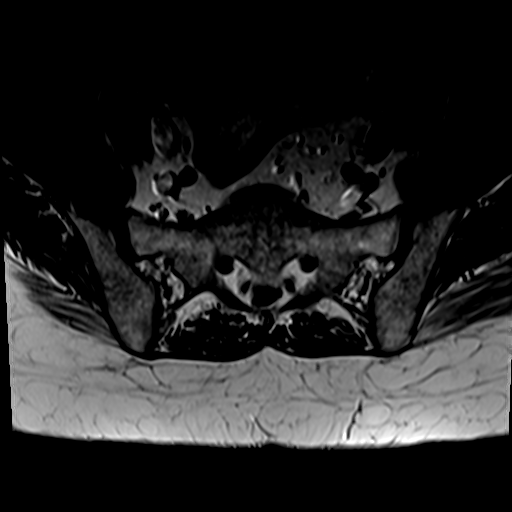
[im 5/32]
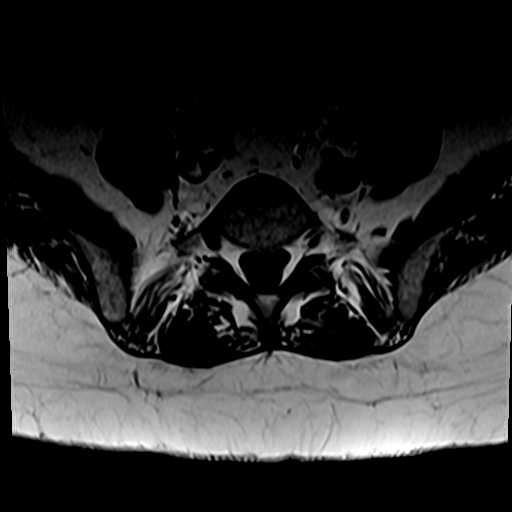
[im 9/32]
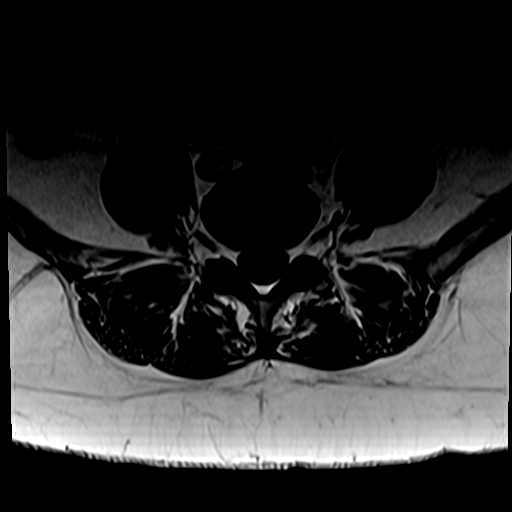
[im 14/32]
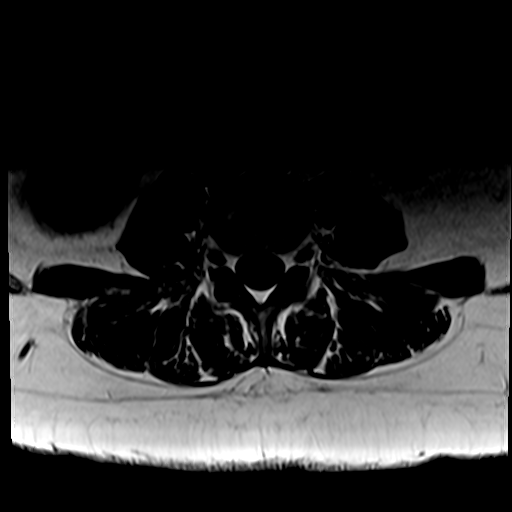
[im 16/32]
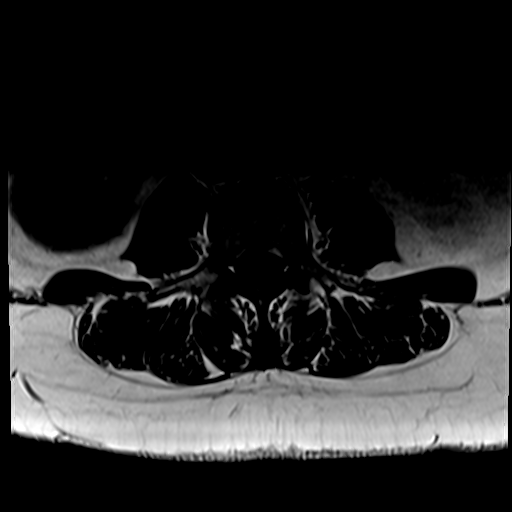
[im 18/32]
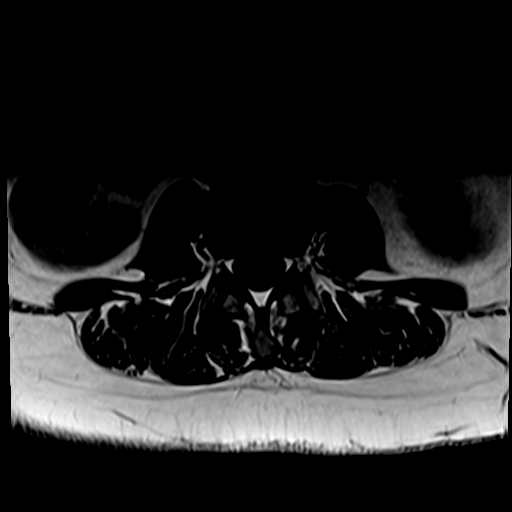
[im 23/32]
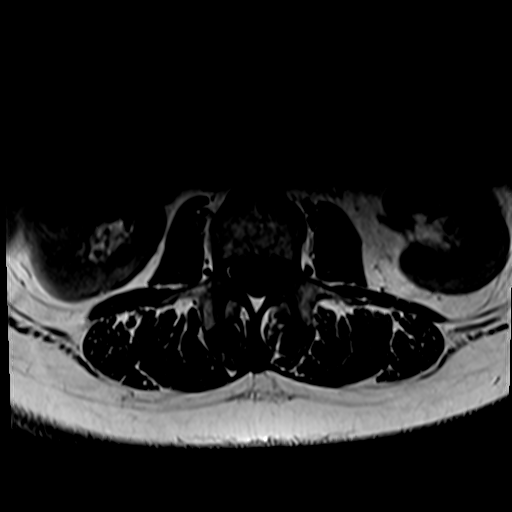
[im 27/32]
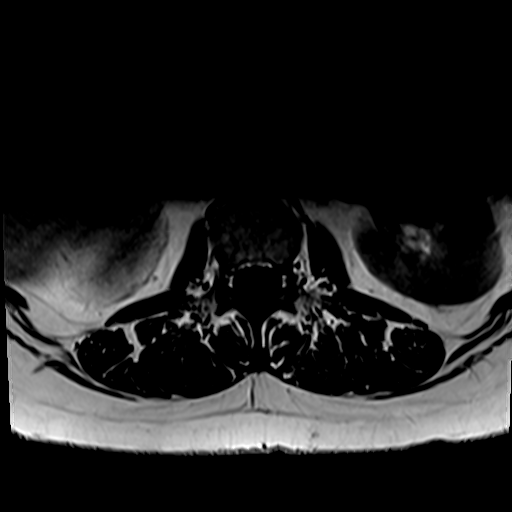
[im 32/32]
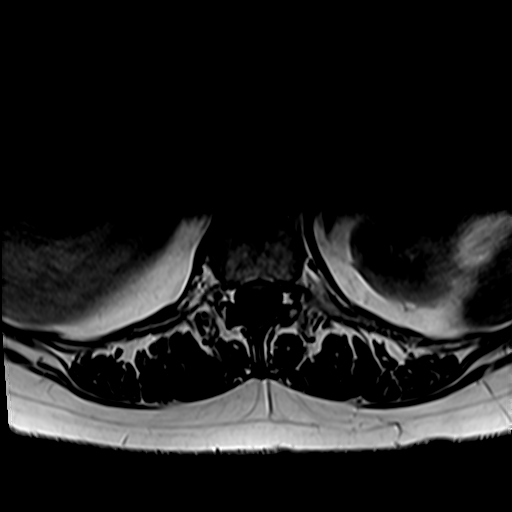

[31 of 48 positions shown; findings below may reference images not displayed]

FINDINGS: Segmentation:  Standard.

Alignment:  Physiologic.

Vertebrae: No acute fracture, evidence of discitis, or aggressive
bone lesion.

Conus medullaris and cauda equina: Conus extends to the T12-L1
level. Conus and cauda equina appear normal.

Paraspinal and other soft tissues: No acute paraspinal abnormality.

Disc levels:

Disc spaces: Disc spaces are maintained.

T12-L1: No significant disc bulge. No neural foraminal stenosis. No
central canal stenosis.

L1-L2: No significant disc bulge. No neural foraminal stenosis. No
central canal stenosis.

L2-L3: No significant disc bulge. No neural foraminal stenosis. No
central canal stenosis.

L3-L4: Minimal broad-based disc bulge. No foraminal or central canal
stenosis.

L4-L5: Minimal broad-based disc bulge. No foraminal or central canal
stenosis.

L5-S1: No significant disc bulge. No neural foraminal stenosis. No
central canal stenosis.
IMPRESSION: 1. No significant lumbar spine disc protrusion, foraminal stenosis
or central canal stenosis.
2. No acute osseous injury of the lumbar spine.

## 2023-07-30 ENCOUNTER — Other Ambulatory Visit: Payer: Self-pay | Admitting: Internal Medicine

## 2023-07-30 DIAGNOSIS — E890 Postprocedural hypothyroidism: Secondary | ICD-10-CM

## 2023-08-01 NOTE — Telephone Encounter (Signed)
 Requested medications are due for refill today.  yes  Requested medications are on the active medications list.  yes  Last refill. 01/31/2023 #90 1 rf  Future visit scheduled.   no  Notes to clinic.  Abnormal labs. Labs nearly expired.    Requested Prescriptions  Pending Prescriptions Disp Refills   levothyroxine (SYNTHROID) 100 MCG tablet [Pharmacy Med Name: LEVOTHYROXINE 100 MCG TABLET] 90 tablet 1    Sig: TAKE 1 TABLET BY MOUTH DAILY BEFORE BREAKFAST.     Endocrinology:  Hypothyroid Agents Failed - 08/01/2023  9:16 AM      Failed - TSH in normal range and within 360 days    TSH  Date Value Ref Range Status  08/03/2022 0.363 (L) 0.450 - 4.500 uIU/mL Final         Passed - Valid encounter within last 12 months    Recent Outpatient Visits           1 month ago Bronchitis   [place] [facility] at [facility], Nyoka Cowden, MD   8 months ago Acute non-recurrent maxillary sinusitis   [place] [facility] at [facility], Nyoka Cowden, MD   12 months ago Postoperative hypothyroidism   [place] [facility] at [facility], Nyoka Cowden, MD   1 year ago Acute non-recurrent maxillary sinusitis   [place] [facility] at [facility], Nyoka Cowden, MD   2 years ago Community acquired pneumonia of right lower lobe of lung   [facility] at [facility], Nyoka Cowden, MD

## 2023-11-01 ENCOUNTER — Other Ambulatory Visit: Payer: Self-pay | Admitting: Internal Medicine

## 2023-11-01 DIAGNOSIS — E890 Postprocedural hypothyroidism: Secondary | ICD-10-CM

## 2023-11-03 ENCOUNTER — Other Ambulatory Visit: Payer: Self-pay

## 2023-11-03 NOTE — Telephone Encounter (Signed)
 Requested medication (s) are due for refill today: yes  Requested medication (s) are on the active medication list: yes  Last refill:  08/01/23  Future visit scheduled: no  Notes to clinic:  Unable to refill per protocol due to failed labs, no updated results.      Requested Prescriptions  Pending Prescriptions Disp Refills   levothyroxine  (SYNTHROID ) 100 MCG tablet [Pharmacy Med Name: LEVOTHYROXINE  100 MCG TABLET] 90 tablet 0    Sig: TAKE 1 TABLET BY MOUTH DAILY BEFORE BREAKFAST.     Endocrinology:  Hypothyroid Agents Failed - 11/03/2023  1:27 PM      Failed - TSH in normal range and within 360 days    TSH  Date Value Ref Range Status  08/03/2022 0.363 (L) 0.450 - 4.500 uIU/mL Final         Failed - Valid encounter within last 12 months    Recent Outpatient Visits   None

## 2023-12-06 ENCOUNTER — Other Ambulatory Visit: Payer: Self-pay | Admitting: Internal Medicine

## 2023-12-06 DIAGNOSIS — E890 Postprocedural hypothyroidism: Secondary | ICD-10-CM

## 2023-12-07 ENCOUNTER — Telehealth: Payer: Self-pay

## 2023-12-07 DIAGNOSIS — E890 Postprocedural hypothyroidism: Secondary | ICD-10-CM

## 2023-12-07 DIAGNOSIS — J189 Pneumonia, unspecified organism: Secondary | ICD-10-CM

## 2023-12-07 MED ORDER — ALBUTEROL SULFATE HFA 108 (90 BASE) MCG/ACT IN AERS: 2.0000 | INHALATION_SPRAY | Freq: Four times a day (QID) | RESPIRATORY_TRACT | 0 refills | Status: DC | PRN

## 2023-12-07 MED ORDER — LEVOTHYROXINE SODIUM 100 MCG PO TABS: 100.0000 ug | ORAL_TABLET | Freq: Every day | ORAL | 0 refills | Status: DC

## 2023-12-07 NOTE — Telephone Encounter (Signed)
 Copied from CRM [id number]. Topic: Clinical - Prescription Issue >> Dec 07, 2023  3:15 PM Ivette P wrote: Reason for CRM: Pt called in to schedule appt, appt scheduled for 07/11 @ 4PM. Pt would need meds to hold over. Asking to cover meds until OV on 07/11  levothyroxine  (SYNTHROID ) 100 MCG tablet albuterol  (VENTOLIN  HFA) 108 (90 Base) MCG/ACT inhaler

## 2023-12-07 NOTE — Telephone Encounter (Signed)
 Pt will get new lab order at her appt.  KP  Copied from CRM [id number]. Topic: Clinical - Request for Lab/Test Order >> Dec 07, 2023  3:16 PM Rachael Miller wrote: Reason for CRM: Pt has an appt for 12/15/2023 and needing a thyroid check and will need a lab order.

## 2023-12-07 NOTE — Telephone Encounter (Signed)
 Meds sent

## 2023-12-07 NOTE — Telephone Encounter (Signed)
 Requested Prescriptions  Pending Prescriptions Disp Refills   levothyroxine  (SYNTHROID ) 100 MCG tablet [Pharmacy Med Name: LEVOTHYROXINE  100 MCG TABLET] 90 tablet 0    Sig: TAKE 1 TABLET BY MOUTH EVERY DAY BEFORE BREAKFAST     Endocrinology:  Hypothyroid Agents Failed - 12/07/2023  4:08 PM      Failed - TSH in normal range and within 360 days    TSH  Date Value Ref Range Status  08/03/2022 0.363 (L) 0.450 - 4.500 uIU/mL Final         Failed - Valid encounter within last 12 months    Recent Outpatient Visits   None

## 2023-12-15 ENCOUNTER — Ambulatory Visit: Admitting: Internal Medicine

## 2023-12-20 ENCOUNTER — Ambulatory Visit: Admitting: Internal Medicine

## 2023-12-20 ENCOUNTER — Encounter: Payer: Self-pay | Admitting: Internal Medicine

## 2023-12-20 VITALS — BP 124/78 | HR 110 | Ht 60.0 in | Wt 147.0 lb

## 2023-12-20 DIAGNOSIS — J329 Chronic sinusitis, unspecified: Secondary | ICD-10-CM | POA: Insufficient documentation

## 2023-12-20 DIAGNOSIS — E890 Postprocedural hypothyroidism: Secondary | ICD-10-CM | POA: Diagnosis not present

## 2023-12-20 DIAGNOSIS — J4520 Mild intermittent asthma, uncomplicated: Secondary | ICD-10-CM | POA: Diagnosis not present

## 2023-12-20 DIAGNOSIS — N951 Menopausal and female climacteric states: Secondary | ICD-10-CM | POA: Insufficient documentation

## 2023-12-20 DIAGNOSIS — F17200 Nicotine dependence, unspecified, uncomplicated: Secondary | ICD-10-CM

## 2023-12-20 DIAGNOSIS — M7072 Other bursitis of hip, left hip: Secondary | ICD-10-CM | POA: Insufficient documentation

## 2023-12-20 MED ORDER — FLUTICASONE-SALMETEROL 100-50 MCG/ACT IN AEPB: 1.0000 | INHALATION_SPRAY | Freq: Two times a day (BID) | RESPIRATORY_TRACT | 5 refills | Status: DC

## 2023-12-20 MED ORDER — FLUOXETINE HCL 10 MG PO CAPS: 10.0000 mg | ORAL_CAPSULE | Freq: Every day | ORAL | 1 refills | Status: DC

## 2023-12-20 MED ORDER — PREDNISONE 10 MG PO TABS: ORAL_TABLET | ORAL | 0 refills | Status: AC

## 2023-12-20 MED ORDER — ALBUTEROL SULFATE HFA 108 (90 BASE) MCG/ACT IN AERS: 2.0000 | INHALATION_SPRAY | Freq: Four times a day (QID) | RESPIRATORY_TRACT | 0 refills | Status: DC | PRN

## 2023-12-20 NOTE — Assessment & Plan Note (Signed)
 Recurrent left lateral hip issues Will treat with steroid taper - if recurrent would refer to SM or Ortho

## 2023-12-20 NOTE — Assessment & Plan Note (Signed)
 Exacerbated by smoking Encourage her to quit completely Continue Albuterol  MDI prn Add Wixela bid

## 2023-12-20 NOTE — Assessment & Plan Note (Signed)
Encouraged to quit completely.

## 2023-12-20 NOTE — Assessment & Plan Note (Signed)
 Doing well - some menopausal sx No constipation or weight gain Will check labs and adjust dose

## 2023-12-20 NOTE — Assessment & Plan Note (Signed)
 Chronic congestion with mucoid drainage - improves briefly after antibiotics then returns Recommend Flonase spray daily Will hold off on antibiotics and refer to ENT

## 2023-12-20 NOTE — Progress Notes (Signed)
 Date:  12/20/2023   Name:  Rachael Miller   DOB:  [birth year]   MRN:  [id number]   Chief Complaint: Hypothyroidism  Thyroid Problem Presents for follow-up visit. Symptoms include anxiety. Patient reports no constipation, diarrhea, fatigue or palpitations. The symptoms have been stable.  Sinus Problem This is a recurrent problem. The problem is unchanged. There has been no fever. Associated symptoms include congestion, headaches and sinus pressure. Pertinent negatives include no chills, coughing or shortness of breath. Past treatments include antibiotics and nasal decongestants. The treatment provided moderate relief.  Depression        This is a new problem.  The current episode started more than 1 month ago.   The onset quality is gradual.   The problem occurs daily.The problem is unchanged.  Associated symptoms include irritable, decreased interest and headaches.  Associated symptoms include no fatigue and no myalgias.( Related to menopause)  Past medical history includes thyroid problem.   Asthma She complains of wheezing. There is no cough or shortness of breath. This is a chronic problem. The problem occurs daily. Associated symptoms include headaches. Pertinent negatives include no chest pain, myalgias or trouble swallowing. Exacerbated by: smoking. Her symptoms are alleviated by beta-agonist. Her past medical history is significant for asthma.  Hip Pain  There was no injury mechanism. The pain is present in the left hip. The quality of the pain is described as aching and burning. The pain is moderate. The pain has been Constant since onset. Associated symptoms comments: Radiates down lateral left leg.    Review of Systems  Constitutional:  Negative for chills, fatigue and unexpected weight change.  HENT:  Positive for congestion and sinus pressure. Negative for trouble swallowing.   Eyes:  Negative for visual disturbance.  Respiratory:  Positive for wheezing. Negative for  cough, chest tightness and shortness of breath.   Cardiovascular:  Negative for chest pain, palpitations and leg swelling.  Gastrointestinal:  Negative for abdominal pain, constipation and diarrhea.  Genitourinary:  Negative for frequency and urgency.  Musculoskeletal:  Positive for arthralgias and gait problem. Negative for myalgias.  Skin:  Negative for color change and rash.  Neurological:  Positive for headaches. Negative for dizziness, weakness and light-headedness.  Psychiatric/Behavioral:  Positive for depression, dysphoric mood and sleep disturbance. The patient is nervous/anxious.      Lab Results  Component Value Date   NA 142 08/03/2022   K 4.0 08/03/2022   CO2 21 08/03/2022   GLUCOSE 108 (H) 08/03/2022   BUN 19 08/03/2022   CREATININE 1.02 (H) 08/03/2022   CALCIUM 9.2 08/03/2022   EGFR 66 08/03/2022   GFRNONAA >60 09/03/2019   No results found for: CHOL, HDL, LDLCALC, LDLDIRECT, TRIG, CHOLHDL Lab Results  Component Value Date   TSH 0.363 (L) 08/03/2022   No results found for: HGBA1C Lab Results  Component Value Date   WBC 7.8 08/03/2022   HGB 14.6 08/03/2022   HCT 42.8 08/03/2022   MCV 96 08/03/2022   PLT 267 08/03/2022   Lab Results  Component Value Date   ALT 34 (H) 08/03/2022   AST 22 08/03/2022   ALKPHOS 82 08/03/2022   BILITOT <0.2 08/03/2022   No results found for: MARIEN BOLLS, VD25OH   Patient Active Problem List   Diagnosis Date Noted   Recurrent sinus infections 12/20/2023   Bursitis of left hip 12/20/2023   Post menopausal syndrome 12/20/2023   Colon cancer screening 09/01/2022   Gastroesophageal reflux disease 03/01/2021  Low back pain with sciatica 03/23/2018   Hx of ovarian cancer 04/12/2016   Tobacco use disorder 04/12/2016   Postoperative hypothyroidism 04/12/2016   Asthma in adult, mild intermittent, uncomplicated 04/03/2016    Allergies  Allergen Reactions   Sulfamethoxazole-Trimethoprim Shortness  Of Breath   Flagyl [Metronidazole] Hives    Past Surgical History:  Procedure Laterality Date   ABDOMINAL HYSTERECTOMY     CESAREAN SECTION     COLONOSCOPY WITH PROPOFOL  N/A 09/01/2022   Procedure: COLONOSCOPY WITH PROPOFOL ;  Surgeon: Unk Corinn Skiff, MD;  Location: [facility];  Service: Endoscopy;  Laterality: N/A;   COLONOSCOPY WITH PROPOFOL  N/A 09/02/2022   Procedure: COLONOSCOPY WITH PROPOFOL ;  Surgeon: Unk Corinn Skiff, MD;  Location: [facility];  Service: Gastroenterology;  Laterality: N/A;   ESOPHAGOGASTRODUODENOSCOPY (EGD) WITH PROPOFOL  N/A 09/01/2022   Procedure: ESOPHAGOGASTRODUODENOSCOPY (EGD) WITH PROPOFOL ;  Surgeon: Unk Corinn Skiff, MD;  Location: [facility];  Service: Endoscopy;  Laterality: N/A;   SHOULDER SURGERY Right    THYROIDECTOMY  2007   goiter   TOTAL VAGINAL HYSTERECTOMY  2000   ovarian cancer    Social History   Tobacco Use   Smoking status: Every Day    Current packs/day: 0.00    Average packs/day: 0.5 packs/day for 30.0 years (15.0 ttl pk-yrs)    Types: Cigarettes    Start date: 10/23/1988    Last attempt to quit: 10/24/2018    Years since quitting: 5.1   Smokeless tobacco: Never   Tobacco comments:    Down to 1-2 cigs per day and intends to quit completely soon  Vaping Use   Vaping status: Never Used  Substance Use Topics   Alcohol use: Yes    Alcohol/week: 2.0 standard drinks of alcohol    Types: 2 Cans of beer per week    Comment: occ.   Drug use: No     Medication list has been reviewed and updated.  Current Meds  Medication Sig   FLUoxetine  (PROZAC ) 10 MG capsule Take 1 capsule (10 mg total) by mouth daily.   fluticasone -salmeterol (WIXELA INHUB) 100-50 MCG/ACT AEPB Inhale 1 puff into the lungs 2 (two) times daily.   levothyroxine  (SYNTHROID ) 100 MCG tablet Take 1 tablet (100 mcg total) by mouth daily before breakfast.   predniSONE  (DELTASONE ) 10 MG tablet Take 6 tablets (60 mg total) by mouth daily with  breakfast for 1 day, THEN 5 tablets (50 mg total) daily with breakfast for 1 day, THEN 4 tablets (40 mg total) daily with breakfast for 1 day, THEN 3 tablets (30 mg total) daily with breakfast for 1 day, THEN 2 tablets (20 mg total) daily with breakfast for 1 day, THEN 1 tablet (10 mg total) daily with breakfast for 1 day.   [DISCONTINUED] albuterol  (VENTOLIN  HFA) 108 (90 Base) MCG/ACT inhaler Inhale 2 puffs into the lungs every 6 (six) hours as needed for wheezing or shortness of breath.       12/20/2023    3:49 PM 06/05/2023    1:44 PM 11/28/2022   11:12 AM 08/03/2022    3:45 PM  GAD 7 : Generalized Anxiety Score  Nervous, Anxious, on Edge 0 0 0 0  Control/stop worrying 0 0 0 0  Worry too much - different things 0 0 0 0  Trouble relaxing 0 0 0 0  Restless 0 0 0 0  Easily annoyed or irritable 3 0 0 0  Afraid - awful might happen 0 0 0 0  Total GAD 7  Score 3 0 0 0  Anxiety Difficulty Very difficult Not difficult at all Not difficult at all Not difficult at all       12/20/2023    3:49 PM 06/05/2023    1:43 PM 11/28/2022   11:12 AM  Depression screen PHQ 2/9  Decreased Interest 0 0 0  Down, Depressed, Hopeless 0 0 0  PHQ - 2 Score 0 0 0  Altered sleeping 0 0 0  Tired, decreased energy 3 3 0  Change in appetite 0 1 0  Feeling bad or failure about yourself  0 0 0  Trouble concentrating 0 0 0  Moving slowly or fidgety/restless 0 0 0  Suicidal thoughts 0 0 0  PHQ-9 Score 3 4 0  Difficult doing work/chores Not difficult at all Not difficult at all Not difficult at all    BP Readings from Last 3 Encounters:  12/20/23 124/78  06/05/23 124/78  09/02/22 121/83    Physical Exam Vitals and nursing note reviewed.  Constitutional:      General: She is irritable. She is not in acute distress.    Appearance: Normal appearance. She is well-developed.  HENT:     Head: Normocephalic and atraumatic.  Cardiovascular:     Rate and Rhythm: Normal rate and regular rhythm.  Pulmonary:      Effort: Pulmonary effort is normal. No respiratory distress.     Breath sounds: Wheezing present. No rhonchi.  Musculoskeletal:     Cervical back: Normal range of motion. No tenderness.     Left hip: Bony tenderness present. No deformity. Normal range of motion.     Right lower leg: No edema.     Left lower leg: No edema.  Lymphadenopathy:     Cervical: No cervical adenopathy.  Skin:    General: Skin is warm and dry.     Findings: No rash.  Neurological:     General: No focal deficit present.     Mental Status: She is alert and oriented to person, place, and time.  Psychiatric:        Mood and Affect: Mood normal.        Behavior: Behavior normal.     Wt Readings from Last 3 Encounters:  12/20/23 147 lb (66.7 kg)  06/05/23 148 lb (67.1 kg)  11/28/22 142 lb 3.2 oz (64.5 kg)    BP 124/78   Pulse (!) 110   Ht 5' (1.524 m)   Wt 147 lb (66.7 kg)   SpO2 98%   BMI 28.71 kg/m   Assessment and Plan:  Problem List Items Addressed This Visit       Unprioritized   Asthma in adult, mild intermittent, uncomplicated (Chronic)   Exacerbated by smoking Encourage her to quit completely Continue Albuterol  MDI prn Add Wixela bid      Relevant Medications   albuterol  (VENTOLIN  HFA) 108 (90 Base) MCG/ACT inhaler   predniSONE  (DELTASONE ) 10 MG tablet   fluticasone -salmeterol (WIXELA INHUB) 100-50 MCG/ACT AEPB   Postoperative hypothyroidism - Primary (Chronic)   Doing well - some menopausal sx No constipation or weight gain Will check labs and adjust dose      Relevant Orders   TSH + free T4   Tobacco use disorder   Encouraged to quit completely      Recurrent sinus infections   Chronic congestion with mucoid drainage - improves briefly after antibiotics then returns Recommend Flonase spray daily Will hold off on antibiotics and refer to ENT  Relevant Medications   predniSONE  (DELTASONE ) 10 MG tablet   Other Relevant Orders   Ambulatory referral to ENT   Bursitis  of left hip   Recurrent left lateral hip issues Will treat with steroid taper - if recurrent would refer to SM or Ortho      Relevant Medications   predniSONE  (DELTASONE ) 10 MG tablet   Post menopausal syndrome   With mood disorder, decreased libido Will start Prozac  10 mg daily.      Relevant Medications   FLUoxetine  (PROZAC ) 10 MG capsule    Return in about 4 months (around 04/21/2024) for CPX.    Leita HILARIO Adie, MD [facility] [place]

## 2023-12-20 NOTE — Assessment & Plan Note (Signed)
 With mood disorder, decreased libido Will start Prozac  10 mg daily.

## 2024-01-23 ENCOUNTER — Other Ambulatory Visit: Payer: Self-pay | Admitting: Internal Medicine

## 2024-01-23 DIAGNOSIS — J4520 Mild intermittent asthma, uncomplicated: Secondary | ICD-10-CM

## 2024-01-24 NOTE — Telephone Encounter (Signed)
 Requested Prescriptions  Pending Prescriptions Disp Refills   albuterol  (VENTOLIN  HFA) 108 (90 Base) MCG/ACT inhaler [Pharmacy Med Name: ALBUTEROL  HFA (PROVENTIL ) INH] 6.7 each 0    Sig: TAKE 2 PUFFS BY MOUTH EVERY 6 HOURS AS NEEDED FOR WHEEZE OR SHORTNESS OF BREATH     Pulmonology:  Beta Agonists 2 Passed - 01/24/2024  2:49 PM      Passed - Last BP in normal range    BP Readings from Last 1 Encounters:  12/20/23 124/78         Passed - Last Heart Rate in normal range    Pulse Readings from Last 1 Encounters:  12/20/23 (!) 110         Passed - Valid encounter within last 12 months    Recent Outpatient Visits           1 month ago Postoperative hypothyroidism   [facility] at [facility], Leita DEL, MD

## 2024-03-06 ENCOUNTER — Other Ambulatory Visit: Payer: Self-pay | Admitting: Internal Medicine

## 2024-03-06 DIAGNOSIS — E890 Postprocedural hypothyroidism: Secondary | ICD-10-CM

## 2024-03-07 NOTE — Telephone Encounter (Signed)
 Requested medications are due for refill today.  yes  Requested medications are on the active medications list.  yes  Last refill. 12/07/2023 #30 0 rf  Future visit scheduled.   yes  Notes to clinic.  Abnormal labs.    Requested Prescriptions  Pending Prescriptions Disp Refills   levothyroxine  (SYNTHROID ) 100 MCG tablet [Pharmacy Med Name: LEVOTHYROXINE  100 MCG TABLET] 90 tablet     Sig: TAKE 1 TABLET BY MOUTH EVERY DAY BEFORE BREAKFAST     Endocrinology:  Hypothyroid Agents Failed - 03/07/2024  2:13 PM      Failed - TSH in normal range and within 360 days    TSH  Date Value Ref Range Status  08/03/2022 0.363 (L) 0.450 - 4.500 uIU/mL Final         Passed - Valid encounter within last 12 months    Recent Outpatient Visits           2 months ago Postoperative hypothyroidism   [facility] at [facility], Leita DEL, MD

## 2024-04-02 ENCOUNTER — Other Ambulatory Visit: Payer: Self-pay | Admitting: Internal Medicine

## 2024-04-02 DIAGNOSIS — E890 Postprocedural hypothyroidism: Secondary | ICD-10-CM

## 2024-04-03 NOTE — Telephone Encounter (Signed)
 Too soon for refill, LRF 03/07/24.  Requested Prescriptions  Pending Prescriptions Disp Refills   levothyroxine  (SYNTHROID ) 100 MCG tablet [Pharmacy Med Name: LEVOTHYROXINE  100 MCG TABLET] 30 tablet     Sig: TAKE 1 TABLET BY MOUTH DAILY BEFORE BREAKFAST.     Endocrinology:  Hypothyroid Agents Failed - 04/03/2024 12:59 PM      Failed - TSH in normal range and within 360 days    TSH  Date Value Ref Range Status  08/03/2022 0.363 (L) 0.450 - 4.500 uIU/mL Final         Passed - Valid encounter within last 12 months    Recent Outpatient Visits           3 months ago Postoperative hypothyroidism   [facility] at [facility], Leita DEL, MD

## 2024-04-16 ENCOUNTER — Other Ambulatory Visit: Payer: Self-pay | Admitting: Internal Medicine

## 2024-04-16 DIAGNOSIS — Z1231 Encounter for screening mammogram for malignant neoplasm of breast: Secondary | ICD-10-CM

## 2024-04-19 ENCOUNTER — Encounter: Payer: Self-pay | Admitting: Internal Medicine

## 2024-04-19 DIAGNOSIS — Z90710 Acquired absence of both cervix and uterus: Secondary | ICD-10-CM | POA: Insufficient documentation

## 2024-04-23 ENCOUNTER — Encounter: Payer: Self-pay | Admitting: Internal Medicine

## 2024-04-23 ENCOUNTER — Encounter: Admitting: Internal Medicine

## 2024-04-23 NOTE — Assessment & Plan Note (Deleted)
 Supplemented. Lab Results  Component Value Date   TSH 0.363 (L) 08/03/2022

## 2024-04-23 NOTE — Progress Notes (Deleted)
 Date:  04/23/2024   Name:  Adrieanna  Shatarra Wehling   DOB:  Jan 31, 1971   MRN:  [id number]   Chief Complaint: No chief complaint on file. Breuna  Ania Levay is a 53 y.o. female who presents today for her Complete Annual Exam. She feels {DESC; WELL/FAIRLY WELL/POORLY:18703}. She reports exercising ***. She reports she is sleeping {DESC; WELL/FAIRLY WELL/POORLY:18703}. Breast complaints ***.  Health Maintenance  Topic Date Due   Hepatitis C Screening  Never done   DTaP/Tdap/Td vaccine (1 - Tdap) Never done   Pneumococcal Vaccine for age over 36 (1 of 2 - PCV) Never done   Hepatitis B Vaccine (1 of 3 - 19+ 3-dose series) Never done   Zoster (Shingles) Vaccine (1 of 2) Never done   Flu Shot  01/05/2024   COVID-19 Vaccine (3 - 2025-26 season) 02/05/2024   Breast Cancer Screening  04/27/2024   Colon Cancer Screening  09/02/2027   HPV Vaccine  Aged Out   Meningitis B Vaccine  Aged Out   HIV Screening  Discontinued    Thyroid Problem Presents for follow-up visit. Patient reports no constipation, diarrhea, fatigue or palpitations. The symptoms have been stable.  Depression        This is a new problem.  The current episode started more than 1 month ago.   Associated symptoms include no fatigue, no myalgias and no headaches.  Past medical history includes thyroid problem.   Asthma There is no cough, shortness of breath or wheezing. Pertinent negatives include no chest pain, headaches, myalgias or trouble swallowing. Her symptoms are alleviated by beta-agonist and steroid inhaler. She reports significant improvement on treatment. Her past medical history is significant for asthma.    Review of Systems  Constitutional:  Negative for fatigue and unexpected weight change.  HENT:  Negative for trouble swallowing.   Eyes:  Negative for visual disturbance.  Respiratory:  Negative for cough, chest tightness, shortness of breath and wheezing.   Cardiovascular:  Negative for chest pain,  palpitations and leg swelling.  Gastrointestinal:  Negative for abdominal pain, constipation and diarrhea.  Musculoskeletal:  Negative for arthralgias and myalgias.  Neurological:  Negative for dizziness, weakness, light-headedness and headaches.  Psychiatric/Behavioral:  Positive for depression.      Lab Results  Component Value Date   NA 142 08/03/2022   K 4.0 08/03/2022   CO2 21 08/03/2022   GLUCOSE 108 (H) 08/03/2022   BUN 19 08/03/2022   CREATININE 1.02 (H) 08/03/2022   CALCIUM 9.2 08/03/2022   EGFR 66 08/03/2022   GFRNONAA >60 09/03/2019   No results found for: CHOL, HDL, LDLCALC, LDLDIRECT, TRIG, CHOLHDL Lab Results  Component Value Date   TSH 0.363 (L) 08/03/2022   No results found for: HGBA1C Lab Results  Component Value Date   WBC 7.8 08/03/2022   HGB 14.6 08/03/2022   HCT 42.8 08/03/2022   MCV 96 08/03/2022   PLT 267 08/03/2022   Lab Results  Component Value Date   ALT 34 (H) 08/03/2022   AST 22 08/03/2022   ALKPHOS 82 08/03/2022   BILITOT <0.2 08/03/2022   No results found for: MARIEN BOLLS, VD25OH   Patient Active Problem List   Diagnosis Date Noted   S/P TAH-BSO (total abdominal hysterectomy and bilateral salpingo-oophorectomy) 04/19/2024   Recurrent sinus infections 12/20/2023   Bursitis of left hip 12/20/2023   Post menopausal syndrome 12/20/2023   Colon cancer screening 09/01/2022   Gastroesophageal reflux disease 03/01/2021   Low back pain with sciatica  03/23/2018   Hx of ovarian cancer 04/12/2016   Tobacco use disorder 04/12/2016   Postoperative hypothyroidism 04/12/2016   Asthma in adult, mild intermittent, uncomplicated 04/03/2016    Allergies  Allergen Reactions   Sulfamethoxazole-Trimethoprim Shortness Of Breath   Flagyl [Metronidazole] Hives    Past Surgical History:  Procedure Laterality Date   ABDOMINAL HYSTERECTOMY     CESAREAN SECTION     COLONOSCOPY WITH PROPOFOL  N/A 09/01/2022   Procedure:  COLONOSCOPY WITH PROPOFOL ;  Surgeon: Unk Corinn Skiff, MD;  Location: [facility];  Service: Endoscopy;  Laterality: N/A;   COLONOSCOPY WITH PROPOFOL  N/A 09/02/2022   Procedure: COLONOSCOPY WITH PROPOFOL ;  Surgeon: Unk Corinn Skiff, MD;  Location: [facility];  Service: Gastroenterology;  Laterality: N/A;   ESOPHAGOGASTRODUODENOSCOPY (EGD) WITH PROPOFOL  N/A 09/01/2022   Procedure: ESOPHAGOGASTRODUODENOSCOPY (EGD) WITH PROPOFOL ;  Surgeon: Unk Corinn Skiff, MD;  Location: [facility];  Service: Endoscopy;  Laterality: N/A;   SHOULDER SURGERY Right    THYROIDECTOMY  2007   goiter   TOTAL VAGINAL HYSTERECTOMY  2000   ovarian cancer    Social History   Tobacco Use   Smoking status: Every Day    Current packs/day: 0.00    Average packs/day: 0.5 packs/day for 30.0 years (15.0 ttl pk-yrs)    Types: Cigarettes    Start date: 10/23/1988    Last attempt to quit: 10/24/2018    Years since quitting: 5.5   Smokeless tobacco: Never   Tobacco comments:    Down to 1-2 cigs per day and intends to quit completely soon  Vaping Use   Vaping status: Never Used  Substance Use Topics   Alcohol use: Yes    Alcohol/week: 2.0 standard drinks of alcohol    Types: 2 Cans of beer per week    Comment: occ.   Drug use: No     Medication list has been reviewed and updated.  No outpatient medications have been marked as taking for the 04/23/24 encounter (Appointment) with Justus Leita DEL, MD.       12/20/2023    3:49 PM 06/05/2023    1:44 PM 11/28/2022   11:12 AM 08/03/2022    3:45 PM  GAD 7 : Generalized Anxiety Score  Nervous, Anxious, on Edge 0 0 0 0  Control/stop worrying 0 0 0 0  Worry too much - different things 0 0 0 0  Trouble relaxing 0 0 0 0  Restless 0 0 0 0  Easily annoyed or irritable 3 0 0 0  Afraid - awful might happen 0 0 0 0  Total GAD 7 Score 3 0 0 0  Anxiety Difficulty Very difficult Not difficult at all Not difficult at all Not difficult at all        12/20/2023    3:49 PM 06/05/2023    1:43 PM 11/28/2022   11:12 AM  Depression screen PHQ 2/9  Decreased Interest 0 0 0  Down, Depressed, Hopeless 0 0 0  PHQ - 2 Score 0 0 0  Altered sleeping 0 0 0  Tired, decreased energy 3 3 0  Change in appetite 0 1 0  Feeling bad or failure about yourself  0 0 0  Trouble concentrating 0 0 0  Moving slowly or fidgety/restless 0 0 0  Suicidal thoughts 0 0 0  PHQ-9 Score 3  4  0   Difficult doing work/chores Not difficult at all Not difficult at all Not difficult at all     Data saved with  a previous flowsheet row definition    BP Readings from Last 3 Encounters:  12/20/23 124/78  06/05/23 124/78  09/02/22 121/83    Physical Exam Vitals and nursing note reviewed.  Constitutional:      General: She is not in acute distress.    Appearance: She is well-developed.  HENT:     Head: Normocephalic and atraumatic.     Right Ear: Tympanic membrane and ear canal normal.     Left Ear: Tympanic membrane and ear canal normal.     Nose:     Right Sinus: No maxillary sinus tenderness.     Left Sinus: No maxillary sinus tenderness.  Eyes:     General: No scleral icterus.       Right eye: No discharge.        Left eye: No discharge.     Conjunctiva/sclera: Conjunctivae normal.  Neck:     Thyroid: No thyromegaly.     Vascular: No carotid bruit.  Cardiovascular:     Rate and Rhythm: Normal rate and regular rhythm.     Pulses: Normal pulses.     Heart sounds: Normal heart sounds.  Pulmonary:     Effort: Pulmonary effort is normal. No respiratory distress.     Breath sounds: No wheezing.  Abdominal:     General: Bowel sounds are normal.     Palpations: Abdomen is soft.     Tenderness: There is no abdominal tenderness.  Musculoskeletal:     Cervical back: Normal range of motion. No erythema.     Right lower leg: No edema.     Left lower leg: No edema.  Lymphadenopathy:     Cervical: No cervical adenopathy.  Skin:    General: Skin is  warm and dry.     Findings: No rash.  Neurological:     Mental Status: She is alert and oriented to person, place, and time.     Cranial Nerves: No cranial nerve deficit.     Sensory: No sensory deficit.     Deep Tendon Reflexes: Reflexes are normal and symmetric.  Psychiatric:        Attention and Perception: Attention normal.        Mood and Affect: Mood normal.     Wt Readings from Last 3 Encounters:  12/20/23 147 lb (66.7 kg)  06/05/23 148 lb (67.1 kg)  11/28/22 142 lb 3.2 oz (64.5 kg)    There were no vitals taken for this visit.  Assessment and Plan:  Problem List Items Addressed This Visit       Unprioritized   Postoperative hypothyroidism (Chronic)   Colon cancer screening   Post menopausal syndrome   Other Visit Diagnoses       Annual physical exam    -  Primary     Encounter for screening mammogram for breast cancer       scheduled for December     Need for hepatitis C screening test         Screening for lipid disorders         Screening for diabetes mellitus           No follow-ups on file.    Leita HILARIO Adie, MD [facility] [place]

## 2024-05-22 ENCOUNTER — Encounter

## 2024-06-18 ENCOUNTER — Other Ambulatory Visit: Payer: Self-pay | Admitting: Medical Genetics

## 2024-06-25 ENCOUNTER — Encounter

## 2024-06-26 ENCOUNTER — Ambulatory Visit: Admitting: Student

## 2024-06-28 ENCOUNTER — Encounter: Payer: Self-pay | Admitting: Student

## 2024-06-28 ENCOUNTER — Ambulatory Visit: Admitting: Student

## 2024-06-28 VITALS — BP 121/79 | HR 99 | Temp 97.8°F | Ht 60.0 in | Wt 147.2 lb

## 2024-06-28 DIAGNOSIS — M25562 Pain in left knee: Secondary | ICD-10-CM | POA: Diagnosis not present

## 2024-06-28 DIAGNOSIS — J4520 Mild intermittent asthma, uncomplicated: Secondary | ICD-10-CM

## 2024-06-28 DIAGNOSIS — E890 Postprocedural hypothyroidism: Secondary | ICD-10-CM

## 2024-06-28 DIAGNOSIS — N951 Menopausal and female climacteric states: Secondary | ICD-10-CM | POA: Diagnosis not present

## 2024-06-28 DIAGNOSIS — J329 Chronic sinusitis, unspecified: Secondary | ICD-10-CM

## 2024-06-28 MED ORDER — BUDESONIDE-FORMOTEROL FUMARATE 80-4.5 MCG/ACT IN AERO: 2.0000 | INHALATION_SPRAY | Freq: Two times a day (BID) | RESPIRATORY_TRACT | 3 refills | Status: AC

## 2024-06-28 MED ORDER — MELOXICAM 7.5 MG PO TABS: 7.5000 mg | ORAL_TABLET | Freq: Every day | ORAL | 0 refills | Status: AC

## 2024-06-28 NOTE — Assessment & Plan Note (Signed)
 Using albuterol  once a day, reports wixela not affordable, will trial Symbicort .

## 2024-06-28 NOTE — Progress Notes (Unsigned)
 "  Established Patient Office Visit  Subjective   Patient ID: Rachael Miller, female    DOB: Jun 19, 1970  Age: 54 y.o. MRN: [id number]  Chief Complaint  Patient presents with   Hypothyroidism   Sinus Problem    Stuffy for three day, headache for three days, over counter isn't helping. Sinus pressure.   Leg Pain    Left side by knee     Rachael Miller is a 54 y.o. person with medical hx listed below who presents today for transfer of care.   Is taking Advil cold and sinus without improvement. Feels pressure in the head with dry cough.   Also have L lateral knee pain in the last few weeks  Recalls not injury, no redness or swelling, pain with standing and walking.   Denies fever, no shortness of breath, chest, whezzing.   Patient Active Problem List   Diagnosis Date Noted   Lateral knee pain, left 07/03/2024   S/P TAH-BSO (total abdominal hysterectomy and bilateral salpingo-oophorectomy) 04/19/2024   Chronic sinusitis 12/20/2023   Bursitis of left hip 12/20/2023   Post menopausal syndrome 12/20/2023   Gastroesophageal reflux disease 03/01/2021   Low back pain with sciatica 03/23/2018   Hx of ovarian cancer 04/12/2016   Tobacco use disorder 04/12/2016   Postoperative hypothyroidism 04/12/2016   Asthma in adult, mild intermittent, uncomplicated 04/03/2016      ROS Refer to HPI    Objective:     Outpatient Encounter Medications as of 06/28/2024  Medication Sig   albuterol  (VENTOLIN  HFA) 108 (90 Base) MCG/ACT inhaler TAKE 2 PUFFS BY MOUTH EVERY 6 HOURS AS NEEDED FOR WHEEZE OR SHORTNESS OF BREATH (Patient taking differently: as needed for wheezing or shortness of breath.)   budesonide -formoterol  (SYMBICORT ) 80-4.5 MCG/ACT inhaler Inhale 2 puffs into the lungs 2 (two) times daily.   levothyroxine  (SYNTHROID ) 100 MCG tablet TAKE 1 TABLET BY MOUTH EVERY DAY BEFORE BREAKFAST   meloxicam  (MOBIC ) 7.5 MG tablet Take 1 tablet (7.5 mg total) by mouth daily.    [DISCONTINUED] FLUoxetine  (PROZAC ) 10 MG capsule Take 1 capsule (10 mg total) by mouth daily. (Patient not taking: Reported on 06/28/2024)   [DISCONTINUED] fluticasone -salmeterol (WIXELA INHUB) 100-50 MCG/ACT AEPB Inhale 1 puff into the lungs 2 (two) times daily. (Patient not taking: Reported on 06/28/2024)   No facility-administered encounter medications on file as of 06/28/2024.    BP 121/79   Pulse 99   Temp 97.8 F (36.6 C) (Oral)   Ht 5' (1.524 m)   Wt 147 lb 4 oz (66.8 kg)   SpO2 98%   BMI 28.76 kg/m  BP Readings from Last 3 Encounters:  06/28/24 121/79  12/20/23 124/78  06/05/23 124/78    Physical Exam Constitutional:      Appearance: Normal appearance.  HENT:     Right Ear: Tympanic membrane normal.     Left Ear: Tympanic membrane normal.     Nose: Congestion and rhinorrhea present.     Right Sinus: Maxillary sinus tenderness and frontal sinus tenderness present.     Left Sinus: Maxillary sinus tenderness and frontal sinus tenderness present.     Mouth/Throat:     Mouth: Mucous membranes are moist.     Pharynx: Oropharynx is clear.  Eyes:     Extraocular Movements: Extraocular movements intact.     Conjunctiva/sclera: Conjunctivae normal.     Pupils: Pupils are equal, round, and reactive to light.  Cardiovascular:     Rate and Rhythm: Normal rate and  regular rhythm.     Heart sounds: No murmur heard. Pulmonary:     Effort: Pulmonary effort is normal.     Breath sounds: No rhonchi or rales.  Abdominal:     General: Abdomen is flat. Bowel sounds are normal. There is no distension.     Palpations: Abdomen is soft.     Tenderness: There is no abdominal tenderness.  Musculoskeletal:     Right lower leg: No edema.     Left lower leg: No edema.     Comments: TTP of the latera joint line of the L knee, negative Mcmurray, no joint laxity   Skin:    General: Skin is warm and dry.     Capillary Refill: Capillary refill takes less than 2 seconds.  Neurological:      General: No focal deficit present.     Mental Status: She is alert and oriented to person, place, and time.  Psychiatric:        Mood and Affect: Mood normal.        Behavior: Behavior normal.        06/28/2024    4:43 PM 12/20/2023    3:49 PM 06/05/2023    1:43 PM  Depression screen PHQ 2/9  Decreased Interest 0 0 0  Down, Depressed, Hopeless 0 0 0  PHQ - 2 Score 0 0 0  Altered sleeping 0 0 0  Tired, decreased energy 0 3 3  Change in appetite 0 0 1  Feeling bad or failure about yourself  0 0 0  Trouble concentrating 0 0 0  Moving slowly or fidgety/restless 0 0 0  Suicidal thoughts 0 0 0  PHQ-9 Score 0 3  4   Difficult doing work/chores Not difficult at all Not difficult at all Not difficult at all     Data saved with a previous flowsheet row definition       06/28/2024    4:44 PM 12/20/2023    3:49 PM 06/05/2023    1:44 PM 11/28/2022   11:12 AM  GAD 7 : Generalized Anxiety Score  Nervous, Anxious, on Edge 0 0  0  0   Control/stop worrying 0 0  0  0   Worry too much - different things 0 0  0  0   Trouble relaxing 0 0  0  0   Restless 0 0  0  0   Easily annoyed or irritable 0 3  0  0   Afraid - awful might happen 0 0  0  0   Total GAD 7 Score 0 3 0 0  Anxiety Difficulty Not difficult at all Very difficult Not difficult at all Not difficult at all     Data saved with a previous flowsheet row definition    Results for orders placed or performed in visit on 06/28/24  TSH  Result Value Ref Range   TSH 0.881 0.450 - 4.500 uIU/mL  T4  Result Value Ref Range   T4, Total 7.7 4.5 - 12.0 ug/dL    Last CBC Lab Results  Component Value Date   WBC 7.8 08/03/2022   HGB 14.6 08/03/2022   HCT 42.8 08/03/2022   MCV 96 08/03/2022   MCH 32.7 08/03/2022   RDW 13.0 08/03/2022   PLT 267 08/03/2022   Last metabolic panel Lab Results  Component Value Date   GLUCOSE 108 (H) 08/03/2022   NA 142 08/03/2022   K 4.0 08/03/2022   CL 100 08/03/2022  CO2 21 08/03/2022   BUN 19  08/03/2022   CREATININE 1.02 (H) 08/03/2022   EGFR 66 08/03/2022   CALCIUM 9.2 08/03/2022   PROT 6.9 08/03/2022   ALBUMIN 4.9 08/03/2022   LABGLOB 2.0 08/03/2022   AGRATIO 2.5 (H) 08/03/2022   BILITOT <0.2 08/03/2022   ALKPHOS 82 08/03/2022   AST 22 08/03/2022   ALT 34 (H) 08/03/2022   ANIONGAP 11 09/03/2019       The ASCVD Risk score (Arnett DK, et al., 2019) failed to calculate for the following reasons:   Cannot find a previous HDL lab   Cannot find a previous total cholesterol lab   * - Cholesterol units were assumed    Assessment & Plan:  Postoperative hypothyroidism Assessment & Plan: Supplemented with levothyroxine  100 mcg daily. TSH and T4 today, adjust medication based on results.   Orders: -     TSH -     T4  Post menopausal syndrome Assessment & Plan: No improvement on prozac  now off. Continue to monitor mood. May benefit from therapy if still have persistent symptoms.    Asthma in adult, mild intermittent, uncomplicated Assessment & Plan: Using albuterol  once a day, reports wixela not affordable, will trial Symbicort .    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 Will give 2 weeks of meloxicam  and referral to PT.   Orders: -     Ambulatory referral to Physical Therapy  Chronic sinusitis, unspecified location Assessment & Plan: Continue to have symptoms, worse in the last 3 days, has not seen ENT. Continue nasal corticosteroids and nasal saline. Will hold off on antibiotics today.    Other orders -     Meloxicam ; Take 1 tablet (7.5 mg total) by mouth daily.  Dispense: 14 tablet; Refill: 0 -     Budesonide -Formoterol  Fumarate; Inhale 2 puffs into the lungs 2 (two) times daily.  Dispense: 10.2 g; Refill: 3      Harlene Saddler, MD "

## 2024-06-28 NOTE — Assessment & Plan Note (Signed)
 No improvement on prozac 

## 2024-06-29 LAB — TSH: TSH: 0.881 u[IU]/mL (ref 0.450–4.500)

## 2024-06-29 LAB — T4: T4, Total: 7.7 ug/dL (ref 4.5–12.0)

## 2024-07-02 ENCOUNTER — Ambulatory Visit: Payer: Self-pay | Admitting: Student

## 2024-07-03 DIAGNOSIS — M25562 Pain in left knee: Secondary | ICD-10-CM | POA: Insufficient documentation

## 2024-07-03 NOTE — Assessment & Plan Note (Signed)
 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 Will give 2 weeks of meloxicam  and referral to PT.

## 2024-07-03 NOTE — Assessment & Plan Note (Signed)
 Continue to have symptoms, worse in the last 3 days, has not seen ENT. Continue nasal corticosteroids and nasal saline. Will hold off on antibiotics today.

## 2024-07-03 NOTE — Assessment & Plan Note (Signed)
 Supplemented with levothyroxine  100 mcg daily. TSH and T4 today, adjust medication based on results.

## 2024-07-09 ENCOUNTER — Other Ambulatory Visit: Payer: Self-pay | Admitting: Student

## 2024-07-09 DIAGNOSIS — E890 Postprocedural hypothyroidism: Secondary | ICD-10-CM

## 2024-07-11 ENCOUNTER — Encounter: Payer: Self-pay | Admitting: Family Medicine

## 2024-07-11 ENCOUNTER — Other Ambulatory Visit: Payer: Self-pay | Admitting: Student

## 2024-07-11 ENCOUNTER — Ambulatory Visit: Payer: Self-pay

## 2024-07-11 ENCOUNTER — Ambulatory Visit: Admitting: Family Medicine

## 2024-07-11 DIAGNOSIS — J4520 Mild intermittent asthma, uncomplicated: Secondary | ICD-10-CM

## 2024-07-11 DIAGNOSIS — E890 Postprocedural hypothyroidism: Secondary | ICD-10-CM

## 2024-07-11 MED ORDER — PROMETHAZINE-DM 6.25-15 MG/5ML PO SYRP: 5.0000 mL | ORAL_SOLUTION | Freq: Four times a day (QID) | ORAL | 0 refills | Status: AC | PRN

## 2024-07-11 MED ORDER — AMOXICILLIN-POT CLAVULANATE 875-125 MG PO TABS: 1.0000 | ORAL_TABLET | Freq: Two times a day (BID) | ORAL | 0 refills | Status: AC

## 2024-07-11 MED ORDER — ALBUTEROL SULFATE HFA 108 (90 BASE) MCG/ACT IN AERS: 2.0000 | INHALATION_SPRAY | Freq: Four times a day (QID) | RESPIRATORY_TRACT | 0 refills | Status: AC | PRN

## 2024-07-11 MED ORDER — PREDNISONE 10 MG PO TABS: ORAL_TABLET | ORAL | 0 refills | Status: AC

## 2024-07-11 NOTE — Telephone Encounter (Signed)
 FYI Only or Action Required?: Action required by provider: medication refill request. Needs thyroid medication refilled  Patient was last seen in primary care on 06/28/2024 by Lemon Raisin, MD.  Called Nurse Triage reporting Shortness of Breath. Cough sinus congestion and pain  Symptoms began several days ago.  Interventions attempted: OTC medications: Advil cold & sinus and robitussin DM.  Symptoms are: gradually worsening.  Triage Disposition: See within 4 hours.  Patient/caregiver understands and will follow disposition?: Yes                           Summary: sinus and head pressure feel like bronchitis wheezing when breathing   Reason for Triage: Day 5 coughing ribs hurt from coughing sinus and head pressure feel like bronchitis wheezing when breathing       Reason for Disposition  [1] MILD difficulty breathing (e.g., minimal/no SOB at rest, SOB with walking, pulse < 100) AND [2] NEW-onset or WORSE than normal  Answer Assessment - Initial Assessment Questions 1. RESPIRATORY STATUS: Describe your breathing? (e.g., wheezing, shortness of breath, unable to speak, severe coughing)      Wheezing chest hurts from coughing, sinus drainage, HA and pressure 2. ONSET: When did this breathing problem begin?      5 days 3. PATTERN Does the difficult breathing come and go, or has it been constant since it started?      constant 4. SEVERITY: How bad is your breathing? (e.g., mild, moderate, severe)      mild 5. RECURRENT SYMPTOM: Have you had difficulty breathing before? If Yes, ask: When was the last time? and What happened that time?      yes 6. CARDIAC HISTORY: Do you have any history of heart disease? (e.g., heart attack, angina, bypass surgery, angioplasty)      no 7. LUNG HISTORY: Do you have any history of lung disease?  (e.g., pulmonary embolus, asthma, emphysema)     Born with crappy lungs 8. CAUSE: What do you think is causing  the breathing problem?      bronchitis 9. OTHER SYMPTOMS: Do you have any other symptoms? (e.g., chest pain, cough, dizziness, fever, runny nose)      Cough - clear, milky yellow  Protocols used: Breathing Difficulty-A-AH

## 2024-07-11 NOTE — Telephone Encounter (Signed)
Noted  Pt has an appt  KP 

## 2024-07-11 NOTE — Progress Notes (Signed)
 "  Acute Office Visit  Subjective:     Patient ID: Rachael  Arlean Miller, female    DOB: Feb 19, 1971, 54 y.o.   MRN: [id number]  Chief Complaint  Patient presents with   Cough    Cough, congestion, sinus pressure, and headache x 5 days. Using inhaler, nasal spray, taking mucinex every 12 hours, and sinus medication. Still having symptoms.    Discussed the use of AI scribe software for clinical note transcription with the patient, who gave verbal consent to proceed.  History of Present Illness Rachael  Indira Miller is a 54 year old female who presents with sinus congestion, headache, facial pain, and cough.  She experiences sinus congestion, headache pressure, and facial pain. She describes her cough as terrible and notes pain in her upper chest area when she coughs. She has a history of being prone to bronchitis, experiencing similar symptoms approximately twice a year since childhood.  She smokes about half a pack of cigarettes a day but has not smoked in the past two days due to feeling unwell. Her sputum is described as white and yellow, thick, and creamy.  She has been using Mucinex DM and maximum strength Sudafed, which initially alleviated her symptoms but now feels like 'a faucet' with symptoms returning. She also reports a cough and wheeze.  She mentions that azithromycin  (Z-Pak) is ineffective for her, but she responds well to erythromycin or Augmentin . She also notes that a good cough medicine would help her sleep better.  She works at [employer] in [place], pharmacist, community. She lives in [facility] and has been isolating herself at home to prevent spreading her illness to family members.     Review of Systems  All other systems reviewed and are negative.       Objective:    BP 138/70   Pulse 84   Temp 97.9 F (36.6 C) (Oral)   Ht 5' (1.524 m)   Wt 148 lb (67.1 kg)   SpO2 99%   BMI 28.90 kg/m     Physical Exam Vitals and nursing note  reviewed.  Constitutional:      Appearance: Normal appearance.  HENT:     Head: Normocephalic.     Right Ear: External ear normal.     Left Ear: External ear normal.  Eyes:     Conjunctiva/sclera: Conjunctivae normal.  Cardiovascular:     Rate and Rhythm: Normal rate.  Pulmonary:     Effort: Pulmonary effort is normal. No respiratory distress.  Abdominal:     Palpations: Abdomen is soft.  Musculoskeletal:        General: Normal range of motion.  Skin:    General: Skin is warm.  Neurological:     Mental Status: She is alert and oriented to person, place, and time.  Psychiatric:        Mood and Affect: Mood normal.     No results found for any visits on 07/11/24.      Assessment & Plan:   Assessment & Plan Asthma in adult, mild intermittent, uncomplicated  Orders:   amoxicillin -clavulanate (AUGMENTIN ) 875-125 MG tablet; Take 1 tablet by mouth 2 (two) times daily for 7 days.   promethazine -dextromethorphan (PROMETHAZINE -DM) 6.25-15 MG/5ML syrup; Take 5 mLs by mouth 4 (four) times daily as needed for cough.   predniSONE  (DELTASONE ) 10 MG tablet; Take 6 tablets (60 mg total) by mouth daily with breakfast for 1 day, THEN 5 tablets (50 mg total) daily with breakfast for 1 day, THEN  4 tablets (40 mg total) daily with breakfast for 1 day, THEN 3 tablets (30 mg total) daily with breakfast for 1 day, THEN 2 tablets (20 mg total) daily with breakfast for 1 day, THEN 1 tablet (10 mg total) daily with breakfast for 1 day.   albuterol  (VENTOLIN  HFA) 108 (90 Base) MCG/ACT inhaler; Inhale 2 puffs into the lungs every 6 (six) hours as needed for wheezing or shortness of breath.   Assessment and Plan Assessment & Plan Acute upper respiratory infection Symptoms returned despite initial treatment. Azithromycin  ineffective. Steroid pack necessary. - Prescribed Augmentin . - Prescribed cough medicine. - Prescribed steroid pack. - Advised to report if symptoms do not improve for potential  longer course of treatment.  Nicotine dependence, cigarettes Smoking cessation advised due to exacerbation of respiratory symptoms. - Advised smoking cessation.     No follow-ups on file.  Lucresha Dismuke K Zahava Quant, MD  "

## 2024-07-11 NOTE — Telephone Encounter (Signed)
 Requested Prescriptions  Pending Prescriptions Disp Refills   levothyroxine  (SYNTHROID ) 100 MCG tablet [Pharmacy Med Name: LEVOTHYROXINE  100 MCG TABLET] 90 tablet 0    Sig: TAKE 1 TABLET BY MOUTH EVERY DAY BEFORE BREAKFAST     Endocrinology:  Hypothyroid Agents Passed - 07/11/2024 12:42 PM      Passed - TSH in normal range and within 360 days    TSH  Date Value Ref Range Status  06/28/2024 0.881 0.450 - 4.500 uIU/mL Final         Passed - Valid encounter within last 12 months    Recent Outpatient Visits           Today Asthma in adult, mild intermittent, uncomplicated   [place] [facility], Vinay K, MD   1 week ago Postoperative hypothyroidism   [facility] at [facility], MD   6 months ago Postoperative hypothyroidism   [facility] at [facility], Leita DEL, MD

## 2024-07-11 NOTE — Assessment & Plan Note (Signed)
" °  Orders:   amoxicillin -clavulanate (AUGMENTIN ) 875-125 MG tablet; Take 1 tablet by mouth 2 (two) times daily for 7 days.   promethazine -dextromethorphan (PROMETHAZINE -DM) 6.25-15 MG/5ML syrup; Take 5 mLs by mouth 4 (four) times daily as needed for cough.   predniSONE  (DELTASONE ) 10 MG tablet; Take 6 tablets (60 mg total) by mouth daily with breakfast for 1 day, THEN 5 tablets (50 mg total) daily with breakfast for 1 day, THEN 4 tablets (40 mg total) daily with breakfast for 1 day, THEN 3 tablets (30 mg total) daily with breakfast for 1 day, THEN 2 tablets (20 mg total) daily with breakfast for 1 day, THEN 1 tablet (10 mg total) daily with breakfast for 1 day.   albuterol  (VENTOLIN  HFA) 108 (90 Base) MCG/ACT inhaler; Inhale 2 puffs into the lungs every 6 (six) hours as needed for wheezing or shortness of breath.  "

## 2024-07-12 NOTE — Telephone Encounter (Signed)
 Requested Prescriptions  Refused Prescriptions Disp Refills   levothyroxine  (SYNTHROID ) 100 MCG tablet 90 tablet 0    Sig: Take 1 tablet (100 mcg total) by mouth daily before breakfast.     Endocrinology:  Hypothyroid Agents Passed - 07/12/2024  2:00 PM      Passed - TSH in normal range and within 360 days    TSH  Date Value Ref Range Status  06/28/2024 0.881 0.450 - 4.500 uIU/mL Final         Passed - Valid encounter within last 12 months    Recent Outpatient Visits           Yesterday Asthma in adult, mild intermittent, uncomplicated   [place] [facility], Vinay K, MD   2 weeks ago Postoperative hypothyroidism   [facility] at [facility], MD   6 months ago Postoperative hypothyroidism   [facility] at [facility], Leita DEL, MD

## 2024-12-27 ENCOUNTER — Ambulatory Visit: Admitting: Student
